# Patient Record
Sex: Female | Born: 1935 | Race: White | Hispanic: No
Health system: Midwestern US, Community
[De-identification: ages and names within clinical notes are randomized; demographics above are authoritative.]

## PROBLEM LIST (undated history)

## (undated) DIAGNOSIS — F03918 Unspecified dementia, unspecified severity, with other behavioral disturbance (HCC): Secondary | ICD-10-CM

## (undated) DIAGNOSIS — F0391 Unspecified dementia with behavioral disturbance: Secondary | ICD-10-CM

## (undated) DIAGNOSIS — I4510 Unspecified right bundle-branch block: Secondary | ICD-10-CM

## (undated) DIAGNOSIS — E785 Hyperlipidemia, unspecified: Secondary | ICD-10-CM

## (undated) DIAGNOSIS — F0280 Dementia in other diseases classified elsewhere without behavioral disturbance: Secondary | ICD-10-CM

## (undated) DIAGNOSIS — R42 Dizziness and giddiness: Secondary | ICD-10-CM

## (undated) DIAGNOSIS — E039 Hypothyroidism, unspecified: Secondary | ICD-10-CM

## (undated) DIAGNOSIS — K219 Gastro-esophageal reflux disease without esophagitis: Secondary | ICD-10-CM

## (undated) DIAGNOSIS — G309 Alzheimer's disease, unspecified: Secondary | ICD-10-CM

## (undated) HISTORY — PX: OTHER SURGICAL HISTORY: SHX169

## (undated) HISTORY — PX: TONSILLECTOMY: SUR1361

## (undated) HISTORY — DX: Gastro-esophageal reflux disease without esophagitis: K21.9

## (undated) HISTORY — PX: TUBAL LIGATION: SHX77

## (undated) HISTORY — PX: PARTIAL HYSTERECTOMY: SHX80

## (undated) HISTORY — PX: EYE SURGERY: SHX253

## (undated) HISTORY — DX: Hyperlipidemia, unspecified: E78.5

## (undated) HISTORY — PX: NASAL SINUS SURGERY: SHX719

---

## 2002-11-22 ENCOUNTER — Ambulatory Visit (HOSPITAL_COMMUNITY): Admission: RE | Admit: 2002-11-22 | Discharge: 2002-11-22 | Payer: Self-pay | Admitting: Family Medicine

## 2002-11-22 ENCOUNTER — Encounter: Payer: Self-pay | Admitting: Family Medicine

## 2003-06-10 ENCOUNTER — Ambulatory Visit (HOSPITAL_COMMUNITY): Admission: RE | Admit: 2003-06-10 | Discharge: 2003-06-10 | Payer: Self-pay | Admitting: Family Medicine

## 2004-12-11 ENCOUNTER — Ambulatory Visit (HOSPITAL_COMMUNITY): Admission: RE | Admit: 2004-12-11 | Discharge: 2004-12-11 | Payer: Self-pay | Admitting: Family Medicine

## 2005-04-11 ENCOUNTER — Ambulatory Visit (HOSPITAL_COMMUNITY): Admission: RE | Admit: 2005-04-11 | Discharge: 2005-04-11 | Payer: Self-pay | Admitting: Family Medicine

## 2005-04-22 ENCOUNTER — Encounter (HOSPITAL_COMMUNITY): Admission: RE | Admit: 2005-04-22 | Discharge: 2005-05-04 | Payer: Self-pay | Admitting: Family Medicine

## 2005-05-06 ENCOUNTER — Encounter (HOSPITAL_COMMUNITY): Admission: RE | Admit: 2005-05-06 | Discharge: 2005-06-05 | Payer: Self-pay | Admitting: Family Medicine

## 2006-01-03 ENCOUNTER — Ambulatory Visit: Payer: Self-pay | Admitting: Internal Medicine

## 2006-01-10 ENCOUNTER — Ambulatory Visit (HOSPITAL_COMMUNITY): Admission: RE | Admit: 2006-01-10 | Discharge: 2006-01-10 | Payer: Self-pay | Admitting: Internal Medicine

## 2006-04-25 ENCOUNTER — Ambulatory Visit: Payer: Self-pay | Admitting: Internal Medicine

## 2006-05-27 ENCOUNTER — Ambulatory Visit: Payer: Self-pay | Admitting: Internal Medicine

## 2006-06-13 ENCOUNTER — Ambulatory Visit: Payer: Self-pay | Admitting: Internal Medicine

## 2006-06-18 ENCOUNTER — Ambulatory Visit: Payer: Self-pay | Admitting: Internal Medicine

## 2006-09-03 ENCOUNTER — Ambulatory Visit (HOSPITAL_COMMUNITY): Admission: RE | Admit: 2006-09-03 | Discharge: 2006-09-03 | Payer: Self-pay | Admitting: Ophthalmology

## 2007-01-22 ENCOUNTER — Telehealth (INDEPENDENT_AMBULATORY_CARE_PROVIDER_SITE_OTHER): Payer: Self-pay | Admitting: Internal Medicine

## 2007-01-26 ENCOUNTER — Ambulatory Visit: Payer: Self-pay | Admitting: Internal Medicine

## 2007-01-26 DIAGNOSIS — M129 Arthropathy, unspecified: Secondary | ICD-10-CM | POA: Insufficient documentation

## 2007-01-26 DIAGNOSIS — M545 Low back pain: Secondary | ICD-10-CM

## 2007-01-26 DIAGNOSIS — H269 Unspecified cataract: Secondary | ICD-10-CM

## 2007-01-26 DIAGNOSIS — K219 Gastro-esophageal reflux disease without esophagitis: Secondary | ICD-10-CM

## 2007-01-26 DIAGNOSIS — E785 Hyperlipidemia, unspecified: Secondary | ICD-10-CM | POA: Insufficient documentation

## 2007-01-26 DIAGNOSIS — T570X4A Toxic effect of arsenic and its compounds, undetermined, initial encounter: Secondary | ICD-10-CM

## 2007-01-26 DIAGNOSIS — Z85828 Personal history of other malignant neoplasm of skin: Secondary | ICD-10-CM

## 2007-01-26 DIAGNOSIS — Z87898 Personal history of other specified conditions: Secondary | ICD-10-CM

## 2007-01-26 DIAGNOSIS — J309 Allergic rhinitis, unspecified: Secondary | ICD-10-CM | POA: Insufficient documentation

## 2007-01-29 ENCOUNTER — Ambulatory Visit (HOSPITAL_COMMUNITY): Admission: RE | Admit: 2007-01-29 | Discharge: 2007-01-29 | Payer: Self-pay | Admitting: Internal Medicine

## 2007-01-30 LAB — CONVERTED CEMR LAB
ALT: 19 units/L (ref 0–35)
AST: 23 units/L (ref 0–37)
Albumin: 4.4 g/dL (ref 3.5–5.2)
Alkaline Phosphatase: 147 units/L — ABNORMAL HIGH (ref 39–117)
BUN: 20 mg/dL (ref 6–23)
Basophils Absolute: 0 10*3/uL (ref 0.0–0.1)
Basophils Relative: 1 % (ref 0–1)
CO2: 28 meq/L (ref 19–32)
Calcium: 9.7 mg/dL (ref 8.4–10.5)
Chloride: 103 meq/L (ref 96–112)
Cholesterol: 267 mg/dL — ABNORMAL HIGH (ref 0–200)
Creatinine, Ser: 0.87 mg/dL (ref 0.40–1.20)
Eosinophils Absolute: 0.1 10*3/uL (ref 0.0–0.7)
Eosinophils Relative: 2 % (ref 0–5)
Glucose, Bld: 95 mg/dL (ref 70–99)
HCT: 46.2 % — ABNORMAL HIGH (ref 36.0–46.0)
HDL: 43 mg/dL (ref >39)
Hemoglobin: 15.5 g/dL — ABNORMAL HIGH (ref 12.0–15.0)
LDL Cholesterol: 184 mg/dL — ABNORMAL HIGH (ref 0–99)
Lymphocytes Relative: 38 % (ref 12–46)
Lymphs Abs: 2.3 10*3/uL (ref 0.7–3.3)
MCHC: 33.5 g/dL (ref 30.0–36.0)
MCV: 90.8 fL (ref 78.0–100.0)
Monocytes Absolute: 0.7 10*3/uL (ref 0.2–0.7)
Monocytes Relative: 11 % (ref 3–11)
Neutro Abs: 3 10*3/uL (ref 1.7–7.7)
Neutrophils Relative %: 49 % (ref 43–77)
Platelets: 313 10*3/uL (ref 150–400)
Potassium: 4.7 meq/L (ref 3.5–5.3)
RBC: 5.09 M/uL (ref 3.87–5.11)
RDW: 13.3 % (ref 11.5–14.0)
Sodium: 140 meq/L (ref 135–145)
Total Bilirubin: 0.5 mg/dL (ref 0.3–1.2)
Total CHOL/HDL Ratio: 6.2
Total Protein: 6.9 g/dL (ref 6.0–8.3)
Triglycerides: 202 mg/dL — ABNORMAL HIGH (ref <150)
VLDL: 40 mg/dL (ref 0–40)
WBC: 6.2 10*3/uL (ref 4.0–10.5)

## 2007-05-12 ENCOUNTER — Ambulatory Visit: Payer: Self-pay | Admitting: Internal Medicine

## 2007-09-11 ENCOUNTER — Ambulatory Visit: Payer: Self-pay | Admitting: Internal Medicine

## 2007-09-11 DIAGNOSIS — J069 Acute upper respiratory infection, unspecified: Secondary | ICD-10-CM | POA: Insufficient documentation

## 2007-09-11 DIAGNOSIS — G4700 Insomnia, unspecified: Secondary | ICD-10-CM | POA: Insufficient documentation

## 2007-11-19 ENCOUNTER — Telehealth (INDEPENDENT_AMBULATORY_CARE_PROVIDER_SITE_OTHER): Payer: Self-pay | Admitting: Internal Medicine

## 2007-11-20 ENCOUNTER — Ambulatory Visit: Payer: Self-pay | Admitting: Internal Medicine

## 2007-11-20 DIAGNOSIS — IMO0002 Reserved for concepts with insufficient information to code with codable children: Secondary | ICD-10-CM

## 2007-11-20 DIAGNOSIS — M25519 Pain in unspecified shoulder: Secondary | ICD-10-CM

## 2007-12-01 ENCOUNTER — Telehealth (INDEPENDENT_AMBULATORY_CARE_PROVIDER_SITE_OTHER): Payer: Self-pay | Admitting: Internal Medicine

## 2008-01-13 ENCOUNTER — Ambulatory Visit: Payer: Self-pay | Admitting: Internal Medicine

## 2008-04-20 ENCOUNTER — Encounter (INDEPENDENT_AMBULATORY_CARE_PROVIDER_SITE_OTHER): Payer: Self-pay | Admitting: Internal Medicine

## 2008-05-04 ENCOUNTER — Ambulatory Visit: Payer: Self-pay | Admitting: Internal Medicine

## 2008-05-09 ENCOUNTER — Ambulatory Visit: Payer: Self-pay | Admitting: Internal Medicine

## 2008-05-09 DIAGNOSIS — J029 Acute pharyngitis, unspecified: Secondary | ICD-10-CM | POA: Insufficient documentation

## 2008-05-18 ENCOUNTER — Encounter (INDEPENDENT_AMBULATORY_CARE_PROVIDER_SITE_OTHER): Payer: Self-pay | Admitting: Internal Medicine

## 2008-05-19 ENCOUNTER — Telehealth (INDEPENDENT_AMBULATORY_CARE_PROVIDER_SITE_OTHER): Payer: Self-pay | Admitting: *Deleted

## 2008-05-24 ENCOUNTER — Telehealth (INDEPENDENT_AMBULATORY_CARE_PROVIDER_SITE_OTHER): Payer: Self-pay | Admitting: *Deleted

## 2008-06-01 ENCOUNTER — Encounter (INDEPENDENT_AMBULATORY_CARE_PROVIDER_SITE_OTHER): Payer: Self-pay | Admitting: Internal Medicine

## 2008-07-11 ENCOUNTER — Ambulatory Visit: Payer: Self-pay | Admitting: Internal Medicine

## 2008-07-11 DIAGNOSIS — J310 Chronic rhinitis: Secondary | ICD-10-CM | POA: Insufficient documentation

## 2008-07-11 DIAGNOSIS — IMO0001 Reserved for inherently not codable concepts without codable children: Secondary | ICD-10-CM

## 2008-07-11 LAB — CONVERTED CEMR LAB
Inflenza A Ag: NEGATIVE
Influenza B Ag: NEGATIVE

## 2008-07-12 ENCOUNTER — Encounter (INDEPENDENT_AMBULATORY_CARE_PROVIDER_SITE_OTHER): Payer: Self-pay | Admitting: Internal Medicine

## 2008-07-12 LAB — CONVERTED CEMR LAB
ALT: 24 units/L (ref 0–35)
AST: 24 units/L (ref 0–37)
Albumin: 4.3 g/dL (ref 3.5–5.2)
Alkaline Phosphatase: 152 units/L — ABNORMAL HIGH (ref 39–117)
BUN: 12 mg/dL (ref 6–23)
Basophils Absolute: 0 10*3/uL (ref 0.0–0.1)
Basophils Relative: 0 % (ref 0–1)
CO2: 27 meq/L (ref 19–32)
Calcium: 9.2 mg/dL (ref 8.4–10.5)
Chloride: 102 meq/L (ref 96–112)
Creatinine, Ser: 0.81 mg/dL (ref 0.40–1.20)
Eosinophils Absolute: 0.2 10*3/uL (ref 0.0–0.7)
Eosinophils Relative: 3 % (ref 0–5)
Glucose, Bld: 89 mg/dL (ref 70–99)
HCT: 45.1 % (ref 36.0–46.0)
Hemoglobin: 15 g/dL (ref 12.0–15.0)
Lymphocytes Relative: 31 % (ref 12–46)
Lymphs Abs: 2.6 10*3/uL (ref 0.7–4.0)
MCHC: 33.3 g/dL (ref 30.0–36.0)
MCV: 91.1 fL (ref 78.0–100.0)
Monocytes Absolute: 1 10*3/uL (ref 0.1–1.0)
Monocytes Relative: 12 % (ref 3–12)
Neutro Abs: 4.4 10*3/uL (ref 1.7–7.7)
Neutrophils Relative %: 54 % (ref 43–77)
Platelets: 362 10*3/uL (ref 150–400)
Potassium: 4.4 meq/L (ref 3.5–5.3)
RBC: 4.95 M/uL (ref 3.87–5.11)
RDW: 13.5 % (ref 11.5–15.5)
Sed Rate: 2 mm/hr (ref 0–22)
Sodium: 140 meq/L (ref 135–145)
TSH: 4.964 microintl units/mL — ABNORMAL HIGH (ref 0.350–4.50)
Total Bilirubin: 0.3 mg/dL (ref 0.3–1.2)
Total CK: 60 units/L (ref 7–177)
Total Protein: 6.7 g/dL (ref 6.0–8.3)
WBC: 8.2 10*3/uL (ref 4.0–10.5)

## 2008-07-13 LAB — CONVERTED CEMR LAB
Free T4: 1 ng/dL (ref 0.89–1.80)
T3, Free: 3 pg/mL (ref 2.3–4.2)

## 2008-08-17 ENCOUNTER — Ambulatory Visit: Payer: Self-pay | Admitting: Internal Medicine

## 2008-09-12 ENCOUNTER — Encounter (INDEPENDENT_AMBULATORY_CARE_PROVIDER_SITE_OTHER): Payer: Self-pay | Admitting: Internal Medicine

## 2008-09-14 ENCOUNTER — Ambulatory Visit: Payer: Self-pay | Admitting: Internal Medicine

## 2008-09-14 DIAGNOSIS — H349 Unspecified retinal vascular occlusion: Secondary | ICD-10-CM | POA: Insufficient documentation

## 2008-09-15 ENCOUNTER — Encounter (INDEPENDENT_AMBULATORY_CARE_PROVIDER_SITE_OTHER): Payer: Self-pay | Admitting: Internal Medicine

## 2008-09-16 LAB — CONVERTED CEMR LAB
BUN: 14 mg/dL (ref 6–23)
CO2: 25 meq/L (ref 19–32)
Calcium: 9.5 mg/dL (ref 8.4–10.5)
Chloride: 104 meq/L (ref 96–112)
Cholesterol: 268 mg/dL — ABNORMAL HIGH (ref 0–200)
Creatinine, Ser: 0.91 mg/dL (ref 0.40–1.20)
Glucose, Bld: 100 mg/dL — ABNORMAL HIGH (ref 70–99)
HDL: 48 mg/dL (ref >39)
LDL Cholesterol: 180 mg/dL — ABNORMAL HIGH (ref 0–99)
Potassium: 4.4 meq/L (ref 3.5–5.3)
Sodium: 142 meq/L (ref 135–145)
Total CHOL/HDL Ratio: 5.6
Triglycerides: 199 mg/dL — ABNORMAL HIGH (ref <150)
VLDL: 40 mg/dL (ref 0–40)

## 2008-10-06 ENCOUNTER — Encounter (INDEPENDENT_AMBULATORY_CARE_PROVIDER_SITE_OTHER): Payer: Self-pay | Admitting: Internal Medicine

## 2008-10-07 ENCOUNTER — Ambulatory Visit: Payer: Self-pay | Admitting: Internal Medicine

## 2008-10-10 ENCOUNTER — Encounter: Admission: RE | Admit: 2008-10-10 | Discharge: 2008-10-10 | Payer: Self-pay | Admitting: Otolaryngology

## 2008-10-13 ENCOUNTER — Telehealth (INDEPENDENT_AMBULATORY_CARE_PROVIDER_SITE_OTHER): Payer: Self-pay | Admitting: Internal Medicine

## 2008-11-23 ENCOUNTER — Ambulatory Visit (HOSPITAL_COMMUNITY): Admission: RE | Admit: 2008-11-23 | Discharge: 2008-11-23 | Payer: Self-pay | Admitting: Family Medicine

## 2008-11-29 ENCOUNTER — Encounter (INDEPENDENT_AMBULATORY_CARE_PROVIDER_SITE_OTHER): Payer: Self-pay | Admitting: Internal Medicine

## 2008-12-12 ENCOUNTER — Encounter: Payer: Self-pay | Admitting: Gastroenterology

## 2008-12-28 ENCOUNTER — Encounter: Payer: Self-pay | Admitting: Gastroenterology

## 2008-12-28 ENCOUNTER — Ambulatory Visit (HOSPITAL_COMMUNITY): Admission: RE | Admit: 2008-12-28 | Discharge: 2008-12-28 | Payer: Self-pay | Admitting: Gastroenterology

## 2008-12-28 ENCOUNTER — Ambulatory Visit: Payer: Self-pay | Admitting: Gastroenterology

## 2008-12-29 HISTORY — PX: COLONOSCOPY: SHX174

## 2008-12-30 ENCOUNTER — Encounter: Payer: Self-pay | Admitting: Gastroenterology

## 2009-04-17 ENCOUNTER — Encounter: Payer: Self-pay | Admitting: Gastroenterology

## 2009-04-24 ENCOUNTER — Encounter: Payer: Self-pay | Admitting: Gastroenterology

## 2009-04-24 ENCOUNTER — Ambulatory Visit (HOSPITAL_COMMUNITY): Admission: RE | Admit: 2009-04-24 | Discharge: 2009-04-24 | Payer: Self-pay | Admitting: Gastroenterology

## 2009-04-24 ENCOUNTER — Ambulatory Visit: Payer: Self-pay | Admitting: Gastroenterology

## 2009-09-05 ENCOUNTER — Ambulatory Visit (HOSPITAL_COMMUNITY): Admission: RE | Admit: 2009-09-05 | Discharge: 2009-09-05 | Payer: Self-pay | Admitting: Family Medicine

## 2009-12-21 ENCOUNTER — Ambulatory Visit (HOSPITAL_COMMUNITY)
Admission: RE | Admit: 2009-12-21 | Discharge: 2009-12-21 | Payer: Self-pay | Source: Home / Self Care | Admitting: Ophthalmology

## 2010-01-11 ENCOUNTER — Ambulatory Visit (HOSPITAL_COMMUNITY): Admission: RE | Admit: 2010-01-11 | Discharge: 2010-01-12 | Payer: Self-pay | Admitting: Ophthalmology

## 2010-04-12 ENCOUNTER — Ambulatory Visit (HOSPITAL_COMMUNITY): Admission: RE | Admit: 2010-04-12 | Discharge: 2010-04-12 | Payer: Self-pay | Admitting: Family Medicine

## 2010-08-27 ENCOUNTER — Encounter: Payer: Self-pay | Admitting: Internal Medicine

## 2010-10-12 ENCOUNTER — Encounter (HOSPITAL_COMMUNITY): Payer: Medicare Other

## 2010-10-12 ENCOUNTER — Other Ambulatory Visit: Payer: Self-pay | Admitting: Urology

## 2010-10-12 LAB — PROTIME-INR
INR: 0.93 (ref 0.00–1.49)
Prothrombin Time: 12.7 seconds (ref 11.6–15.2)

## 2010-10-12 LAB — SURGICAL PCR SCREEN
MRSA, PCR: NEGATIVE
Staphylococcus aureus: NEGATIVE

## 2010-10-12 LAB — CBC
HCT: 43.5 % (ref 36.0–46.0)
Hemoglobin: 14.2 g/dL (ref 12.0–15.0)
MCH: 29.6 pg (ref 26.0–34.0)
MCHC: 32.6 g/dL (ref 30.0–36.0)
MCV: 90.6 fL (ref 78.0–100.0)
Platelets: 277 10*3/uL (ref 150–400)
RBC: 4.8 MIL/uL (ref 3.87–5.11)
RDW: 13.1 % (ref 11.5–15.5)
WBC: 8.3 10*3/uL (ref 4.0–10.5)

## 2010-10-12 LAB — APTT: aPTT: 30 seconds (ref 24–37)

## 2010-10-16 ENCOUNTER — Observation Stay (HOSPITAL_COMMUNITY)
Admission: RE | Admit: 2010-10-16 | Discharge: 2010-10-17 | Disposition: A | Discharge status: Home / Self Care | Payer: Medicare Other | Source: Ambulatory Visit | Attending: Urology | Admitting: Urology

## 2010-10-16 DIAGNOSIS — N8111 Cystocele, midline: Principal | ICD-10-CM | POA: Insufficient documentation

## 2010-10-16 DIAGNOSIS — Z7982 Long term (current) use of aspirin: Secondary | ICD-10-CM | POA: Insufficient documentation

## 2010-10-16 DIAGNOSIS — E669 Obesity, unspecified: Secondary | ICD-10-CM | POA: Insufficient documentation

## 2010-10-16 DIAGNOSIS — K219 Gastro-esophageal reflux disease without esophagitis: Secondary | ICD-10-CM | POA: Insufficient documentation

## 2010-10-16 DIAGNOSIS — Z01812 Encounter for preprocedural laboratory examination: Secondary | ICD-10-CM | POA: Insufficient documentation

## 2010-10-16 DIAGNOSIS — Z79899 Other long term (current) drug therapy: Secondary | ICD-10-CM | POA: Insufficient documentation

## 2010-10-16 DIAGNOSIS — Z01818 Encounter for other preprocedural examination: Secondary | ICD-10-CM | POA: Insufficient documentation

## 2010-10-16 LAB — ABO/RH: ABO/RH(D): A POS

## 2010-10-16 LAB — TYPE AND SCREEN
ABO/RH(D): A POS
Antibody Screen: NEGATIVE

## 2010-10-16 LAB — BASIC METABOLIC PANEL
BUN: 14 mg/dL (ref 6–23)
CO2: 29 mEq/L (ref 19–32)
Calcium: 8.4 mg/dL (ref 8.4–10.5)
Chloride: 106 mEq/L (ref 96–112)
Creatinine, Ser: 0.85 mg/dL (ref 0.4–1.2)
GFR calc Af Amer: >60 mL/min (ref >60)
GFR calc non Af Amer: >60 mL/min (ref >60)
Glucose, Bld: 144 mg/dL — ABNORMAL HIGH (ref 70–99)
Potassium: 4.2 mEq/L (ref 3.5–5.1)
Sodium: 139 mEq/L (ref 135–145)

## 2010-10-16 LAB — HEMOGLOBIN AND HEMATOCRIT, BLOOD
HCT: 37.2 % (ref 36.0–46.0)
Hemoglobin: 12.2 g/dL (ref 12.0–15.0)

## 2010-10-17 LAB — BASIC METABOLIC PANEL
BUN: 9 mg/dL (ref 6–23)
CO2: 30 mEq/L (ref 19–32)
Calcium: 8.5 mg/dL (ref 8.4–10.5)
Chloride: 105 mEq/L (ref 96–112)
Creatinine, Ser: 0.89 mg/dL (ref 0.4–1.2)
GFR calc Af Amer: >60 mL/min (ref >60)
GFR calc non Af Amer: >60 mL/min (ref >60)
Glucose, Bld: 109 mg/dL — ABNORMAL HIGH (ref 70–99)
Potassium: 4.2 mEq/L (ref 3.5–5.1)
Sodium: 138 mEq/L (ref 135–145)

## 2010-10-17 LAB — HEMOGLOBIN AND HEMATOCRIT, BLOOD
HCT: 39.4 % (ref 36.0–46.0)
Hemoglobin: 12.6 g/dL (ref 12.0–15.0)

## 2010-10-18 NOTE — Op Note (Signed)
Claudia Wiggins, Claudia Wiggins                 ACCOUNT NO.:  [id number]  MEDICAL RECORD NO.:  [id number]           PATIENT TYPE:  O  LOCATION:  DAYL                         FACILITY:  [facility]  PHYSICIAN:  Martina Sinner, MD DATE OF BIRTH:  06/24/1934  DATE OF PROCEDURE:  10/16/2010 DATE OF DISCHARGE:                              OPERATIVE REPORT   ASSISTANT:  Delia Chimes, NP.  PREOPERATIVE DIAGNOSES: 1. Cystocele. 2. Vault prolapse.  POSTOPERATIVE DIAGNOSES: 1. Cystocele. 2. Vault prolapse.  SURGERY:  Vault suspension plus cystocele repair plus graft plus cystoscopy.  DESCRIPTION OF PROCEDURE:  Ms. Haubner has the above-mentioned diagnosis. Extra care was taken to position the patient and minimize the risk of compartment syndrome, neuropathy, and DVT.  Preoperative laboratory tests were normal.  Careful inspection was similar to the examination when she was awake.  Her vaginal cuff descended 3-4 cm but actually was reasonably well-supported.  She had a moderate grade 2 cystocele that will bulge almost to the urethrovesical angle.  Once again, it did not look that impressive but was symptomatic.  She had a short vagina.  I placed a 3-0 Vicryl at the sacral dimples, stained cephalad to maximize my length.  I instilled approximately 12-14 mL of lidocaine epinephrine mixture anteriorly.  I made a T-shaped anterior vaginal wall incision and sharply and bluntly dissected the underlying pubocervical fascia from the pelvic sidewall to the white line bilaterally.  I was very happy with the mobilization.  Once again, the cystocele was not that large, and she had a narrow smaller vagina.  Without distorting the anatomy, I did a 2-layer anterior repair.  I was very pleased with it.  I cystoscoped the patient.  Cystoscopically, the repair looked very good.  There was excellent blue jets bilaterally.  There is no injury to the bladder.  I did not imbricate the bladder neck.  I finger  dissected along the white line towards the ischial spine and could feel them bilaterally.  I decided to do an iliococcygeus vault suspension.  Using a Capio device, I placed a 0 Ethibond beyond the ischial spine in a line between the ischial spine and urethrovesical angle along the white line.  I placed an Ethibond bilaterally and was very pleased with the position of the suture in terms of an anatomic iliococcygeus repair.  I then placed 0 Vicryl on a UR6 needle using my usual technique at the level of the urethrovesical angle.  I used a 7 x 4 cm piece of dermis and cut it in a shape of a trapezoid. I sewed the graft in place anatomically and it fit very nicely.  It was not too tight at the bladder neck.  I sutured it to the apical dimples with 3-0 Vicryl twice.  I trimmed a few millimeters of anterior vaginal wall mucosa since there was not a lot of redundancy.  I closed the anterior vaginal wall with 0 Vicryl on a CT-1 needle after a lot of irrigation.  An Estrace pack was inserted.  A hole pack was not inserted since she had a smaller vagina.  Leg position was  good.  Bladder was draining blue urine.  I was very pleased with the surgery.  Hopefully, we will reach her treatment goal.          ______________________________ Martina Sinner, MD     SAM/MEDQ  D:  10/16/2010  T:  10/16/2010  Job:  [id number]  Electronically Signed by Alfredo Martinez MD on 10/18/2010 12:50:36 PM

## 2010-10-22 LAB — CBC
HCT: 43.4 % (ref 36.0–46.0)
Hemoglobin: 14.7 g/dL (ref 12.0–15.0)
MCHC: 34 g/dL (ref 30.0–36.0)
MCV: 92 fL (ref 78.0–100.0)
Platelets: 280 10*3/uL (ref 150–400)
RBC: 4.71 MIL/uL (ref 3.87–5.11)
RDW: 13.1 % (ref 11.5–15.5)
WBC: 7.4 10*3/uL (ref 4.0–10.5)

## 2010-10-22 LAB — BASIC METABOLIC PANEL
BUN: 13 mg/dL (ref 6–23)
CO2: 28 mEq/L (ref 19–32)
Calcium: 9.1 mg/dL (ref 8.4–10.5)
Chloride: 106 mEq/L (ref 96–112)
Creatinine, Ser: 0.9 mg/dL (ref 0.4–1.2)
GFR calc Af Amer: >60 mL/min (ref >60)
GFR calc non Af Amer: >60 mL/min (ref >60)
Glucose, Bld: 107 mg/dL — ABNORMAL HIGH (ref 70–99)
Potassium: 4.5 mEq/L (ref 3.5–5.1)
Sodium: 139 mEq/L (ref 135–145)

## 2010-10-23 LAB — BASIC METABOLIC PANEL
BUN: 16 mg/dL (ref 6–23)
CO2: 30 mEq/L (ref 19–32)
Calcium: 9 mg/dL (ref 8.4–10.5)
Chloride: 104 mEq/L (ref 96–112)
Creatinine, Ser: 0.81 mg/dL (ref 0.4–1.2)
GFR calc Af Amer: >60 mL/min (ref >60)
GFR calc non Af Amer: >60 mL/min (ref >60)
Glucose, Bld: 109 mg/dL — ABNORMAL HIGH (ref 70–99)
Potassium: 5.2 mEq/L — ABNORMAL HIGH (ref 3.5–5.1)
Sodium: 138 mEq/L (ref 135–145)

## 2010-10-23 LAB — HEMOGLOBIN AND HEMATOCRIT, BLOOD
HCT: 41.5 % (ref 36.0–46.0)
Hemoglobin: 14.4 g/dL (ref 12.0–15.0)

## 2010-12-18 NOTE — Op Note (Signed)
NAMEJELISA, Claudia Wiggins                 ACCOUNT NO.:  [id number]   MEDICAL RECORD NO.:  [id number]          PATIENT TYPE:  AMB   LOCATION:  DAY                           FACILITY:  [facility]   PHYSICIAN:  Kassie Mends, M.D.      DATE OF BIRTH:  February 01, 1934   DATE OF PROCEDURE:  DATE OF DISCHARGE:  12/28/2008                               OPERATIVE REPORT   PROCEDURE:  Colonoscopy with snare cautery polypectomy.   INDICATION FOR EXAM:  Claudia Wiggins is a 77 year old female who presents for  average-risk colon cancer screening.   FINDINGS:  1. The patient admitted to not taking the entire prep.  She had a      large amount of liquid stool seen throughout the cecum to the      rectosigmoid junction.  Polyps less than 1 cm would have been      easily missed.  2. An 8-mm sessile cecal polyp removed via snare cautery.  A 1.2-cm      sessile rectosigmoid polyp removed via snare cautery.  Otherwise,      unable to appreciate any masses, inflammatory changes, diverticula,      or AVMs.  3. Large internal hemorrhoids.  Otherwise, normal retroflexed view of      the rectum.  Adequate visualization of the rectum was achieved.   DIAGNOSES:  1. Cecal and rectosigmoid polyps.  2. Large internal hemorrhoids.   RECOMMENDATIONS:  1. The patient should return in 3-6 months with a 2-day MiraLax prep.      She should follow a high-fiber diet.  She was given a handout on      high-fiber diet and polyps.  2. No aspirin, NSAIDs, or anticoagulation for 7 days.   MEDICATIONS:  1. Demerol 50 mg IV.  2. Versed 5 mg IV.   PROCEDURE TECHNIQUE:  Physical exam was performed.  Informed consent was  obtained from the patient after explaining the benefits, risks, and  alternatives to the procedure.  The patient was connected to the monitor  and placed in the left lateral position.  Continuous oxygen was provided  by nasal cannula, IV medicine administered through an indwelling  cannula.  After administration of sedation  and rectal exam, the  patient's rectum was intubated and the scope was advanced under direct  visualization to the cecum.  Scope was removed slowly by carefully  examining the color, texture, anatomy, and integrity of the mucosa on  the way out.  The patient was recovered in endoscopy and discharged home  in satisfactory condition.   ADDENDUM 16109:  Poor bowel prep. TV adenoma without dysplasia. TCS in 3-  6 mos with 2 day prep.      Kassie Mends, M.D.  Electronically Signed     SM/MEDQ  D:  12/28/2008  T:  12/29/2008  Job:  [id number]   cc:   Melvyn Novas, MD  Fax: [phone]

## 2010-12-31 NOTE — Discharge Summary (Signed)
  NAMETYLAR, MERENDINO                 ACCOUNT NO.:  [id number]  MEDICAL RECORD NO.:  [id number]           PATIENT TYPE:  I  LOCATION:  1412                         FACILITY:  [facility]  PHYSICIAN:  Martina Sinner, MD DATE OF BIRTH:  25-Nov-1933  DATE OF ADMISSION:  10/16/2010 DATE OF DISCHARGE:  10/17/2010                              DISCHARGE SUMMARY   ADMISSION DIAGNOSIS:  Cystocele.  DISCHARGE DIAGNOSIS:  Cystocele.  DATE OF SURGERY:  Surgery on the 13th.  PROCEDURE:  Cystocele repair plus vault suspension.  Ms. Zuri Lascala had a vault suspension, was kept in for 23 hours observation.  Laboratory tests were normal 1 day postoperatively.  She was not having pain.  She is ambulating well.  She ate on the day of discharge.  Her surgery went very well with no postoperative complications.  The patient was sent home with pain medication, antibiotics, and has a formal followup appointment.  She was given stool softeners.       ______________________________ Martina Sinner, MD     SAM/MEDQ  D:  12/17/2010  T:  12/17/2010  Job:  [id number]  Electronically Signed by Alfredo Martinez MD on 12/31/2010 06:16:54 PM

## 2011-03-28 ENCOUNTER — Ambulatory Visit (HOSPITAL_COMMUNITY)
Admission: RE | Admit: 2011-03-28 | Discharge: 2011-03-28 | Disposition: A | Discharge status: Home / Self Care | Payer: Medicare Other | Source: Ambulatory Visit | Attending: Physician Assistant | Admitting: Physician Assistant

## 2011-03-28 DIAGNOSIS — R262 Difficulty in walking, not elsewhere classified: Secondary | ICD-10-CM | POA: Insufficient documentation

## 2011-03-28 DIAGNOSIS — M79673 Pain in unspecified foot: Secondary | ICD-10-CM | POA: Insufficient documentation

## 2011-03-28 DIAGNOSIS — M25473 Effusion, unspecified ankle: Secondary | ICD-10-CM | POA: Insufficient documentation

## 2011-03-28 DIAGNOSIS — IMO0001 Reserved for inherently not codable concepts without codable children: Secondary | ICD-10-CM | POA: Insufficient documentation

## 2011-03-28 DIAGNOSIS — M6281 Muscle weakness (generalized): Secondary | ICD-10-CM | POA: Insufficient documentation

## 2011-03-28 DIAGNOSIS — M25579 Pain in unspecified ankle and joints of unspecified foot: Secondary | ICD-10-CM | POA: Insufficient documentation

## 2011-03-28 DIAGNOSIS — M25476 Effusion, unspecified foot: Secondary | ICD-10-CM | POA: Insufficient documentation

## 2011-03-28 NOTE — Progress Notes (Signed)
Physical Therapy Evaluation  Patient Details  Name: JAZZMEN RESTIVO MRN: [id number] Date of Birth: [birth year]  Today's Date: 03/28/2011 Time: 4098-1191 Time Calculation (min): 38 min Visit#: 1 of 8 Re-eval: 04/25/11  Past Medical History: No past medical history on file. Past Surgical History: No past surgical history on file.  Subjective Symptoms/Limitations Symptoms: Ms. Sinor states that she has been having increase pain in her right heel for about eight months now.  The patient states her MD gave her some inserts as well as some medication which has helped her pain about 80%.  She states that if she walks barefoot greater than 10 minutes her pain returns.  The patient states that she is unable to go up steps reciprocally  and at times her pain will go up as high as an 8/10.   The patient has been referred to Physical therapy to allieviate her symptoms.   How long can you sit comfortably?: no problem How long can you stand comfortably?: 10-15 min How long can you walk comfortably?: barefoot 10 min; with shoe and inserts hours. Pain Assessment Currently in Pain?: Yes Pain Score:   2 (goes up as high as an 8) Pain Location: Heel Pain Orientation: Right Pain Type: Chronic pain Pain Onset: More than a month ago Pain Frequency: Constant (varies in intensity.) Pain Relieving Factors: medication, Multiple Pain Sites: No  Precautions/Restrictions     Prior Functioning  Home Living Type of Home: House Lives With: Spouse Home Layout: One level Home Access: Stairs to enter (pt also has a ramp) Entrance Stairs-Rails: Right Entrance Stairs-Number of Steps: 2 Prior Function Level of Independence: Independent with basic ADLs Driving: Yes Vocation: Retired Leisure: Hobbies-yes (Comment) 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 Overall Cognitive Status: Appears within functional limits for tasks assessed Arousal/Alertness: Awake/alert Orientation Level: Oriented  X4  Sensation/Coordination/Flexibility    Assessment RLE Assessment RLE Assessment: Exceptions to [facility] RLE AROM (degrees) Right Ankle Dorsiflexion 0-20: 7  Right Ankle Plantar Flexion 0-45: 45  Right Ankle Inversion: 18  Right Ankle Eversion: 22  RLE Strength RLE Overall Strength: Within Functional Limits for tasks assessed  Mobility (including Balance)       Exercise/Treatments Ankle Exercises Ankle Plantar Flexion:  (gastroc stretch 3x30") Ankle Eversion:  (soleus stretch 3x30 sec.)     Plyometrics    Modalities Modalities: Ultrasound Manual Therapy Manual Therapy: Myofascial release Myofascial Release: massage to gastroc area to relieve tension Ultrasound Ultrasound Location: R heel Ultrasound Parameters: 1 MgHZ @ 1.2 w/cm2 Ultrasound Goals: Pain  Physical Therapy Assessment and Plan PT Assessment and Plan Clinical Impression Statement: Pt with history of plantar fascitis and difficuly walking who will benefit from skilled physical therapy to return patient to prior functional level. Rehab Potential: Good PT Frequency: Min 2X/week PT Duration: 4 weeks PT Treatment/Interventions: Therapeutic exercise (massage to gastroc mm; [place] to heel) PT Plan: begin passive toe extension, plantar fasica and slant board stretch next treatment.  NO heel raises!    Goals PT Short Term Goals Time to Complete Goals: 2 weeks PT Short Term Goal 1: I HEP PT Short Term Goal 2: Pain no greater than a 2 PT Long Term Goals PT Long Term Goal 1: 4 wk able to go up and down steps reciprocally without pain PT Long Term Goal 2: Pt to state that she no longer needs to take her medicationl Long Term Goal 3: Patient able to push her lawnmower without sx of increase pain.  Problem List Patient Active Problem List  Diagnoses  . HYPERLIPIDEMIA  . RETINAL VEIN OCCLUSION  . CATARACT NOS  . PHARYNGITIS  . VIRAL URI  . RHINITIS  . ALLERGIC RHINITIS  . GERD  . ARTHRITIS  . SHOULDER PAIN   . LOW BACK PAIN  . MYALGIA  . INSOMNIA  . SHIN SPLINTS  . TOXIC EFFECT, ARSENIC AND ITS COMPOUNDS  . SKIN CANCER, HX OF  . MIGRAINES, HX OF  . Difficulty in walking  . Pain, heel    PT - End of Session Activity Tolerance: Patient tolerated treatment well General Behavior During Session: [facility] for tasks performed Cognition: [facility] for tasks performed   RUSSELL,CINDY 03/28/2011, 4:50 PM  Physician Documentation Your signature is required to indicate approval of the treatment plan as stated above.  Please sign and either send electronically or make a copy of this report for your files and return this physician signed original.   Please mark one 1.__approve of plan  2. ___approve of plan with the following conditions.   ______________________________                                                          _____________________ Physician Signature                                                                                                             Date

## 2011-03-28 NOTE — Patient Instructions (Addendum)
Hep

## 2011-04-03 ENCOUNTER — Ambulatory Visit (HOSPITAL_COMMUNITY)
Admission: RE | Admit: 2011-04-03 | Discharge: 2011-04-03 | Disposition: A | Discharge status: Home / Self Care | Payer: Medicare Other | Source: Ambulatory Visit | Attending: Physical Therapy | Admitting: Physical Therapy

## 2011-04-03 NOTE — Progress Notes (Signed)
Physical Therapy Treatment Patient Details  Name: Claudia Wiggins MRN: [id number] Date of Birth: [birth year]  Today's Date: 04/03/2011 Time: 4098-1191 Time Calculation (min): 45 min Visit#: 2 of 8 Re-eval: 04/25/11  Subjective: Symptoms/Limitations Symptoms: I'm feeling much better. Pain Assessment Currently in Pain?: Yes Pain Score:   2 Pain Location: Ankle Pain Orientation: Right Pain Type: Chronic pain Pain Onset: More than a month ago Pain Frequency: Constant Pain Relieving Factors: Voltaren Gel Effect of Pain on Daily Activities: no change  Precautions/Restrictions     Mobility (including Balance)       Exercise/Treatments Ankle Exercises Ankle Plantar Flexion:  (3x 30" ) Ankle Eversion:  (Gastroc/soleus stretch x 30 sec) Heel Raises:  (slant board stretch 3/30 seconds) Toe Raise:  (passive toe extension)     Plyometrics    Modalities Modalities: Ultrasound Manual Therapy Manual Therapy: Myofascial release Myofascial Release: massage to medial head of gastroc Ultrasound Ultrasound Location: gastroc tendon and heel while pt is prone and passively stretched. Ultrasound Parameters: 1Mg Hz@ 1.3 w/cm2 Ultrasound Goals: Pain  Physical Therapy Assessment and Plan PT Assessment and Plan Clinical Impression Statement: Pt did well with stretches.  add sls next rx. Rehab Potential: Good PT Frequency: Min 2X/week PT Duration: 4 weeks PT Treatment/Interventions: Therapeutic exercise (modalities for stretching ) PT Plan: begin SLS next rx NO HEEl Raises    Goals    Problem List Patient Active Problem List  Diagnoses  . HYPERLIPIDEMIA  . RETINAL VEIN OCCLUSION  . CATARACT NOS  . PHARYNGITIS  . VIRAL URI  . RHINITIS  . ALLERGIC RHINITIS  . GERD  . ARTHRITIS  . SHOULDER PAIN  . LOW BACK PAIN  . MYALGIA  . INSOMNIA  . SHIN SPLINTS  . TOXIC EFFECT, ARSENIC AND ITS COMPOUNDS  . SKIN CANCER, HX OF  . MIGRAINES, HX OF  . Difficulty in walking  . Pain,  heel    PT - End of Session Activity Tolerance: Patient tolerated treatment well General Behavior During Session: [facility] for tasks performed Cognition: [facility] for tasks performed  Olis Viverette,CINDY 04/03/2011, 10:20 AM

## 2011-04-03 NOTE — Patient Instructions (Signed)
stretches

## 2011-04-05 ENCOUNTER — Ambulatory Visit (HOSPITAL_COMMUNITY)
Admission: RE | Admit: 2011-04-05 | Discharge: 2011-04-05 | Disposition: A | Discharge status: Home / Self Care | Payer: Medicare Other | Source: Ambulatory Visit | Attending: Physical Therapy | Admitting: Physical Therapy

## 2011-04-05 NOTE — Progress Notes (Signed)
Physical Therapy Treatment Patient Details  Name: Claudia Wiggins MRN: [id number] Date of Birth: 1934-06-27  Today's Date: 04/05/2011 Time: 4098-1191 Time Calculation (min): 42 min Visit#:3 of 8 Re-eval: 04/25/11  Subjective: Symptoms/Limitations Symptoms: Pt states she is only having a little soreness in the arch when coming from sit to stand.  Precautions/Restrictions     Mobility (including Balance)       Exercise/Treatments Ankle Exercises  per doc flow sheet     Plyometrics    Modalities Modalities: Ultrasound Manual Therapy Manual Therapy: Myofascial release Myofascial Release: medial gastroc  Ultrasound Ultrasound Location: tendon/heel Ultrasound Parameters: 1 MgHz@1 .3 w/cm2 Ultrasound Goals: Pain  Physical Therapy Assessment and Plan PT Assessment and Plan Clinical Impression Statement: Pt I with stretches. Rehab Potential: Good PT Frequency: Min 2X/week PT Duration: 4 weeks PT Plan: if gastroc mm is not tight next treatment then massage can be discharged    Goals    Problem List Patient Active Problem List  Diagnoses  . HYPERLIPIDEMIA  . RETINAL VEIN OCCLUSION  . CATARACT NOS  . PHARYNGITIS  . VIRAL URI  . RHINITIS  . ALLERGIC RHINITIS  . GERD  . ARTHRITIS  . SHOULDER PAIN  . LOW BACK PAIN  . MYALGIA  . INSOMNIA  . SHIN SPLINTS  . TOXIC EFFECT, ARSENIC AND ITS COMPOUNDS  . SKIN CANCER, HX OF  . MIGRAINES, HX OF  . Difficulty in walking  . Pain, heel    PT - End of Session Activity Tolerance: Patient tolerated treatment well General Behavior During Session: [facility] for tasks performed Cognition: [facility] for tasks performed  Roberta Angell,CINDY 04/05/2011, 1:48 PM

## 2011-04-10 ENCOUNTER — Ambulatory Visit (HOSPITAL_COMMUNITY)
Admission: RE | Admit: 2011-04-10 | Discharge: 2011-04-10 | Disposition: A | Discharge status: Home / Self Care | Payer: Medicare Other | Source: Ambulatory Visit | Attending: Physician Assistant | Admitting: Physician Assistant

## 2011-04-10 DIAGNOSIS — M25579 Pain in unspecified ankle and joints of unspecified foot: Secondary | ICD-10-CM | POA: Insufficient documentation

## 2011-04-10 DIAGNOSIS — IMO0001 Reserved for inherently not codable concepts without codable children: Secondary | ICD-10-CM | POA: Insufficient documentation

## 2011-04-10 DIAGNOSIS — M6281 Muscle weakness (generalized): Secondary | ICD-10-CM | POA: Insufficient documentation

## 2011-04-10 DIAGNOSIS — R262 Difficulty in walking, not elsewhere classified: Secondary | ICD-10-CM | POA: Insufficient documentation

## 2011-04-10 DIAGNOSIS — M25473 Effusion, unspecified ankle: Secondary | ICD-10-CM | POA: Insufficient documentation

## 2011-04-10 DIAGNOSIS — M25476 Effusion, unspecified foot: Secondary | ICD-10-CM | POA: Insufficient documentation

## 2011-04-10 NOTE — Progress Notes (Signed)
Physical Therapy Treatment Patient Details  Name: Claudia Wiggins MRN: [id number] Date of Birth: 03/17/1934  Today's Date: 04/10/2011 Time: 1030-1110 Time Calculation (min): 40 min Visit#: 4 of 8 Re-eval: 04/25/11  Subjective: Symptoms/Limitations Symptoms: Pt states the soreness is better   Precautions/Restrictions     Mobility (including Balance)       Exercise/Treatments Ankle Exercises Ankle Plantar Flexion:  (3x 30" ) Ankle Eversion:  (Gastroc/soleus stretch x 30 sec) Heel Raises:  (slant board stretch 3/30 seconds) Toe Raise:  (passive toe extension)  SLS:  (R 20 sec; L 17 sec)  Plyometrics    Modalities Modalities: Ultrasound Manual Therapy Myofascial Release: massage to mm  Ultrasound Ultrasound Location: tendon Ultrasound Parameters: 1.2 w/cm2 Ultrasound Goals: Pain  Physical Therapy Assessment and Plan PT Assessment and Plan Clinical Impression Statement: Pt has increased mm tightness in medial gastroc this treatment Rehab Potential: Good PT Plan: continut with massage as increased tightness this treatment.    Goals    Problem List Patient Active Problem List  Diagnoses  . HYPERLIPIDEMIA  . RETINAL VEIN OCCLUSION  . CATARACT NOS  . PHARYNGITIS  . VIRAL URI  . RHINITIS  . ALLERGIC RHINITIS  . GERD  . ARTHRITIS  . SHOULDER PAIN  . LOW BACK PAIN  . MYALGIA  . INSOMNIA  . SHIN SPLINTS  . TOXIC EFFECT, ARSENIC AND ITS COMPOUNDS  . SKIN CANCER, HX OF  . MIGRAINES, HX OF  . Difficulty in walking  . Pain, heel    PT - End of Session Activity Tolerance: Patient tolerated treatment well General Behavior During Session: [facility] for tasks performed Cognition: [facility] for tasks performed  RUSSELL,CINDY 04/10/2011, 12:20 PM

## 2011-04-12 ENCOUNTER — Ambulatory Visit (HOSPITAL_COMMUNITY)
Admission: RE | Admit: 2011-04-12 | Discharge: 2011-04-12 | Disposition: A | Discharge status: Home / Self Care | Payer: Medicare Other | Source: Ambulatory Visit | Attending: Family Medicine | Admitting: Family Medicine

## 2011-04-12 NOTE — Progress Notes (Signed)
Physical Therapy Treatment Patient Details  Name: DIESHA ROSTAD MRN: [id number] Date of Birth: 1934/03/13  Today's Date: 04/12/2011 Time: 7829-5621 Time Calculation (min): 42 min Visit#: 4 of 8 Re-eval: 04/25/11  Subjective: Symptoms/Limitations Symptoms: Pt states her heel feels a whole lot better. Pain Assessment Currently in Pain?: No/denies  Precautions/Restrictions     Mobility (including Balance)       Exercise/Treatments Ankle Exercises Ankle Plantar Flexion:  (2x60") Ankle Eversion:  (Gastroc/soleus stretch x2 x 60") Heel Raises:  (slant board stretch 2/60 seconds) Toe Raise:  (passive toe extension)  SLS:  (R 20 sec; L 17 sec)  Plyometrics    Modalities Modalities: Ultrasound Manual Therapy Myofascial Release: massage x8' Ultrasound Ultrasound Location: gastroc tendon and heel Ultrasound Parameters: 3 Mg-Hz @ 1.5w/xm2 x 8 min Ultrasound Goals: Pain  Physical Therapy Assessment and Plan PT Assessment and Plan Clinical Impression Statement: Pt with normal mm tension Rehab Potential: Good PT Plan: D/C massage.     Goals    Problem List Patient Active Problem List  Diagnoses  . HYPERLIPIDEMIA  . RETINAL VEIN OCCLUSION  . CATARACT NOS  . PHARYNGITIS  . VIRAL URI  . RHINITIS  . ALLERGIC RHINITIS  . GERD  . ARTHRITIS  . SHOULDER PAIN  . LOW BACK PAIN  . MYALGIA  . INSOMNIA  . SHIN SPLINTS  . TOXIC EFFECT, ARSENIC AND ITS COMPOUNDS  . SKIN CANCER, HX OF  . MIGRAINES, HX OF  . Difficulty in walking  . Pain, heel    PT - End of Session Activity Tolerance: Patient tolerated treatment well General Behavior During Session: [facility] for tasks performed Cognition: [facility] for tasks performed  RUSSELL,CINDY 04/12/2011, 2:28 PM

## 2011-04-16 ENCOUNTER — Ambulatory Visit (HOSPITAL_COMMUNITY)
Admission: RE | Admit: 2011-04-16 | Discharge: 2011-04-16 | Disposition: A | Discharge status: Home / Self Care | Payer: Medicare Other | Source: Ambulatory Visit | Attending: *Deleted | Admitting: *Deleted

## 2011-04-16 NOTE — Progress Notes (Signed)
Physical Therapy Treatment Patient Details  Name: Claudia Wiggins MRN: [id number] Date of Birth: 1934/02/18  Today's Date: 04/16/2011 Time: 1024 (Pt started by Baird Lyons [place],PTA)-1110 Time Calculation (min): 46 min Visit#: 5 of 8 Re-eval: 04/25/11 Charges: Therex 15' Manual x 10' [place] x 8'  Subjective: Symptoms/Limitations Symptoms: Pt stated no pain today, she rode her TM this weekend for 10' with a little soreness in legs no heel pain Pain Assessment Currently in Pain?: No/denies   Exercise/Treatments Ankle Exercises Ankle Eversion:  (Gastroc/soleus stretch 3 x 60"(soleus) 2x 60"(gastroc)) Heel Raises:  (slant board stretch 3x60 seconds) Toe Raise:  (passive toe extension)  SLS: R 28" L 17" (R 20 sec; L 17 sec)   Physical Therapy Assessment and Plan PT Assessment and Plan Clinical Impression Statement: Minimal spasms noted with manual therapy. Spasms decreased 70% after MFR. Completed massage this tx secondary to pt c/o of increased tightness in R gastroc w/ gastroc stretch. PT Treatment/Interventions: Therapeutic exercise;Other (comment) ([place]; Manual therapy) PT Plan: D/C massage next tx.     Problem List Patient Active Problem List  Diagnoses  . HYPERLIPIDEMIA  . RETINAL VEIN OCCLUSION  . CATARACT NOS  . PHARYNGITIS  . VIRAL URI  . RHINITIS  . ALLERGIC RHINITIS  . GERD  . ARTHRITIS  . SHOULDER PAIN  . LOW BACK PAIN  . MYALGIA  . INSOMNIA  . SHIN SPLINTS  . TOXIC EFFECT, ARSENIC AND ITS COMPOUNDS  . SKIN CANCER, HX OF  . MIGRAINES, HX OF  . Difficulty in walking  . Pain, heel    PT - End of Session Activity Tolerance: Patient tolerated treatment well General Behavior During Session: [facility] for tasks performed Cognition: [facility] for tasks performed  Antonieta Iba 04/16/2011, 11:44 AM

## 2011-04-18 ENCOUNTER — Ambulatory Visit (HOSPITAL_COMMUNITY)
Admission: RE | Admit: 2011-04-18 | Discharge: 2011-04-18 | Disposition: A | Discharge status: Home / Self Care | Payer: Medicare Other | Source: Ambulatory Visit | Attending: Family Medicine | Admitting: Family Medicine

## 2011-04-18 NOTE — Progress Notes (Signed)
Physical Therapy Treatment Patient Details  Name: Claudia Wiggins MRN: [id number] Date of Birth: 11-18-1933  Today's Date: 04/18/2011 Time: 9528-4132 Time Calculation (min): 43 min Visit#: 6 of 8 Re-eval: 04/25/11 Charges:  Therex 15', Manual 8', Ultrasound 8'  Subjective: Symptoms/Limitations Symptoms:   No pain so far; Feeling better. Pain Assessment Currently in Pain?: No/denies    Exercise/Treatments Ankle Exercises  Gastroc/soleus stretch 3 x 60"  Slant board stretch 3x60 seconds  Plantar Fascia Stretch 3X60" Passive toe extension 1X60"  SLS: R 40" L 19"   STM and Ultrasound Ultrasound Location: plantar arch and heel/gastroc Ultrasound Parameters: 3 MHz @ 1.5w/cm2 X 8 minutes Ultrasound Goals: Pain  Physical Therapy Assessment and Plan PT Assessment and Plan Clinical Impression Statement: No spasms with manual today.  Overall improving without pain today. PT Treatment/Interventions: Therapeutic exercise (Ultrasound, STM) PT Plan: Continue X 2 more visits then re-eval.    Goals PT Short Term Goals Time to Complete Goals: 2 weeks PT Short Term Goal 1: I HEP PT Short Term Goal 1 - Progress: Met PT Short Term Goal 2: Pain no greater than a 2 PT Short Term Goal 2 - Progress: Met PT Long Term Goals PT Long Term Goal 1: 4 wk able to go up and down steps reciprocally without pain PT Long Term Goal 1 - Progress: Met PT Long Term Goal 2: Pt to state that she no longer needs to take her medicationl PT Long Term Goal 2 - Progress: Met Long Term Goal 3: Patient able to push her lawnmower without sx of increase pain. Long Term Goal 3 Progress: Met  Problem List Patient Active Problem List  Diagnoses  . HYPERLIPIDEMIA  . RETINAL VEIN OCCLUSION  . CATARACT NOS  . PHARYNGITIS  . VIRAL URI  . RHINITIS  . ALLERGIC RHINITIS  . GERD  . ARTHRITIS  . SHOULDER PAIN  . LOW BACK PAIN  . MYALGIA  . INSOMNIA  . SHIN SPLINTS  . TOXIC EFFECT, ARSENIC AND ITS COMPOUNDS  . SKIN  CANCER, HX OF  . MIGRAINES, HX OF  . Difficulty in walking  . Pain, heel    PT - End of Session Activity Tolerance: Patient tolerated treatment well General Behavior During Session: [facility] for tasks performed Cognition: [facility] for tasks performed  Emeline Gins B 04/18/2011, 10:18 AM

## 2011-04-23 ENCOUNTER — Ambulatory Visit (HOSPITAL_COMMUNITY)
Admission: RE | Admit: 2011-04-23 | Discharge: 2011-04-23 | Disposition: A | Discharge status: Home / Self Care | Payer: Medicare Other | Source: Ambulatory Visit | Attending: Family Medicine | Admitting: Family Medicine

## 2011-04-23 NOTE — Progress Notes (Signed)
Physical Therapy Treatment Patient Details  Name: Claudia Wiggins MRN: [id number] Date of Birth: July 25, 1934  Today's Date: 04/23/2011 Time: 4098-1191 Time Calculation (min): 41 min Visit#: 7  of 8   Re-eval: 04/25/11 Charges: Therex x 20' [place] x 8' Manual x 10'  Subjective: Symptoms/Limitations Symptoms: My pain is about a 1 today. Pain Assessment Currently in Pain?: Yes Pain Score:   1 Pain Location: Ankle Pain Orientation: Right   Exercise/Treatments Ankle Exercises Slant board stretch 3 x 1' Gastroc stretch 3x 1' Plantar stretch 3 x 1' Manual DF self stretch 1 x 30"  Modalities Modalities: Ultrasound Manual Therapy Manual Therapy: Myofascial release Myofascial Release: MFR to release mm spasms in R gastroc/soleus Ultrasound Ultrasound Location: R heel/gastroc Ultrasound Parameters: @ 1.2w/cm2 Ultrasound Goals: Pain  Physical Therapy Assessment and Plan PT Assessment and Plan Clinical Impression Statement: Multiple spasms and tenderness noted in R gastroc/soleus with manual. Pt with pain decrease  to 0/10 at end of session. PT Treatment/Interventions: Therapeutic exercise;Other (comment) ([place]; Manual therapy) PT Plan: Continue x 1 more visits then re-eval.     Problem List Patient Active Problem List  Diagnoses  . HYPERLIPIDEMIA  . RETINAL VEIN OCCLUSION  . CATARACT NOS  . PHARYNGITIS  . VIRAL URI  . RHINITIS  . ALLERGIC RHINITIS  . GERD  . ARTHRITIS  . SHOULDER PAIN  . LOW BACK PAIN  . MYALGIA  . INSOMNIA  . SHIN SPLINTS  . TOXIC EFFECT, ARSENIC AND ITS COMPOUNDS  . SKIN CANCER, HX OF  . MIGRAINES, HX OF  . Difficulty in walking  . Pain, heel    PT - End of Session Activity Tolerance: Patient tolerated treatment well General Behavior During Session: [facility] for tasks performed Cognition: [facility] for tasks performed  Antonieta Iba 04/23/2011, 12:56 PM

## 2011-04-25 ENCOUNTER — Inpatient Hospital Stay (HOSPITAL_COMMUNITY): Admission: RE | Admit: 2011-04-25 | Payer: Medicare Other | Source: Ambulatory Visit | Admitting: Physical Therapy

## 2011-04-25 ENCOUNTER — Telehealth (HOSPITAL_COMMUNITY): Payer: Self-pay

## 2011-04-30 ENCOUNTER — Ambulatory Visit (HOSPITAL_COMMUNITY)
Admission: RE | Admit: 2011-04-30 | Discharge: 2011-04-30 | Disposition: A | Discharge status: Home / Self Care | Payer: Medicare Other | Source: Ambulatory Visit | Attending: Family Medicine | Admitting: Family Medicine

## 2011-04-30 NOTE — Progress Notes (Signed)
Physical Therapy Treatment Patient Details  Name: Claudia Wiggins MRN: [id number] Date of Birth: 21-Mar-1934  Today's Date: 04/30/2011 Time: 4696-2952 Time Calculation (min): 50 min Visit#: 7  of 8   Re-eval: 05/02/11 Charges:  Therex 20, ultrasound 8', manual 12'    Subjective: Symptoms/Limitations Symptoms: Pt. states she had eye surgery and has been unable to come.  States her pain is 1/10 today.  Right is worse than Left Pain Assessment Currently in Pain?: Yes Pain Score:   1 Pain Location: Ankle Pain Orientation: Right   Exercise/Treatments Ankle Exercises Ankle Dorsiflexion: Limitations Ankle Dorsiflexion Limitations: Slant board stretch 3 x 1'; Gastroc stretch 3x 1'; Plantar stretch 3 x 1' Ankle Plantar Flexion Limitations: PF stretch 3X60"  SLS: R:19", L 1:00    Modalities Modalities: Ultrasound Manual Therapy Manual Therapy: Myofascial release Myofascial Release: MFR to decrease spasm in R arch/heel Ultrasound Ultrasound Location: R heel/arch area Ultrasound Parameters: 3 MHz @ 1.4w/cm2 for 8 minutes. Ultrasound Goals: Pain  Physical Therapy Assessment and Plan PT Assessment and Plan Clinical Impression Statement: No spasms palpated in arch/heel, however tightness/pain decreased.  Pt. painfree at end of session. PT Plan: Re-eval next visit.    Goals    Problem List Patient Active Problem List  Diagnoses  . HYPERLIPIDEMIA  . RETINAL VEIN OCCLUSION  . CATARACT NOS  . PHARYNGITIS  . VIRAL URI  . RHINITIS  . ALLERGIC RHINITIS  . GERD  . ARTHRITIS  . SHOULDER PAIN  . LOW BACK PAIN  . MYALGIA  . INSOMNIA  . SHIN SPLINTS  . TOXIC EFFECT, ARSENIC AND ITS COMPOUNDS  . SKIN CANCER, HX OF  . MIGRAINES, HX OF  . Difficulty in walking  . Pain, heel    PT - End of Session Activity Tolerance: Patient tolerated treatment well General Behavior During Session: [facility] for tasks performed Cognition: [facility] for tasks performed  Bascom Levels, Drayk Humbarger B 04/30/2011, 11:09  AM

## 2011-05-02 ENCOUNTER — Ambulatory Visit (HOSPITAL_COMMUNITY)
Admission: RE | Admit: 2011-05-02 | Discharge: 2011-05-02 | Disposition: A | Discharge status: Home / Self Care | Payer: Medicare Other | Source: Ambulatory Visit | Attending: Family Medicine | Admitting: Family Medicine

## 2011-05-02 NOTE — Progress Notes (Addendum)
Physical Therapy Treatment Patient Details  Name: Claudia Wiggins MRN: [id number] Date of Birth: [birth year]  Today's Date: 05/02/2011 Time: 0865-7846 Time Calculation (min): 29 min Visit#: 8  of 8   Re-eval: 05/02/11 Charges: ROM x 1; Manual x 8'   Subjective: Symptoms/Limitations Symptoms: Pt reports she is not having any pain today. Pain Assessment Currently in Pain?: No/denies  Objective: R ankle AROM : WNL         R ankle strength : WNL  Exercise/Treatments  Manual Therapy Manual Therapy: Other (comment) Other Manual Therapy: STM/SCS to R gastroc/soleus x 8'  Physical Therapy Assessment and Plan PT Assessment and Plan Clinical Impression Statement: Manual therapy completed to assess spasms. Multiple spasms noted in medial gastroc. Spasms decreased 80% after STM and SCS. Pt reports HEP compliance and no further functional limitations. All goals have been met. PT Treatment/Interventions: Therapeutic exercise;Other (comment) (Manual therapy) PT Plan: Recommend D/C to PT.    Goals PT Short Term Goals Time to Complete Short Term Goals: 2 weeks PT Short Term Goal 1: I HEP PT Short Term Goal 1 - Progress: Met PT Short Term Goal 2: Pain no greater than a 2 PT Short Term Goal 2 - Progress: Met PT Long Term Goals PT Long Term Goal 1: 4 wk able to go up and down steps reciprocally without pain PT Long Term Goal 1 - Progress: Met PT Long Term Goal 2: Pt to state that she no longer needs to take her medicationl PT Long Term Goal 2 - Progress: Met Long Term Goal 3: Patient able to push her lawnmower without sx of increase pain. Long Term Goal 3 Progress: Met  Problem List Patient Active Problem List  Diagnoses  . HYPERLIPIDEMIA  . RETINAL VEIN OCCLUSION  . CATARACT NOS  . PHARYNGITIS  . VIRAL URI  . RHINITIS  . ALLERGIC RHINITIS  . GERD  . ARTHRITIS  . SHOULDER PAIN  . LOW BACK PAIN  . MYALGIA  . INSOMNIA  . SHIN SPLINTS  . TOXIC EFFECT, ARSENIC AND ITS COMPOUNDS    . SKIN CANCER, HX OF  . MIGRAINES, HX OF  . Difficulty in walking  . Pain, heel    PT - End of Session Activity Tolerance: Patient tolerated treatment well General Behavior During Session: [facility] for tasks performed Cognition: [facility] for tasks performed  Antonieta Iba 05/02/2011, 10:55 AM

## 2011-05-22 ENCOUNTER — Other Ambulatory Visit (HOSPITAL_COMMUNITY): Payer: Self-pay | Admitting: Pulmonary Disease

## 2011-05-22 DIAGNOSIS — M1990 Unspecified osteoarthritis, unspecified site: Secondary | ICD-10-CM

## 2011-05-29 ENCOUNTER — Ambulatory Visit (HOSPITAL_COMMUNITY)
Admission: RE | Admit: 2011-05-29 | Discharge: 2011-05-29 | Disposition: A | Discharge status: Home / Self Care | Payer: Medicare Other | Source: Ambulatory Visit | Attending: Pulmonary Disease | Admitting: Pulmonary Disease

## 2011-05-29 DIAGNOSIS — Z13820 Encounter for screening for osteoporosis: Secondary | ICD-10-CM | POA: Insufficient documentation

## 2011-05-29 DIAGNOSIS — M1990 Unspecified osteoarthritis, unspecified site: Secondary | ICD-10-CM

## 2011-07-09 ENCOUNTER — Ambulatory Visit (INDEPENDENT_AMBULATORY_CARE_PROVIDER_SITE_OTHER): Payer: Medicare Other | Admitting: Orthopedic Surgery

## 2011-07-09 ENCOUNTER — Encounter: Payer: Self-pay | Admitting: Orthopedic Surgery

## 2011-07-09 VITALS — BP 100/70 | Ht 64.0 in | Wt 196.0 lb

## 2011-07-09 DIAGNOSIS — M65319 Trigger thumb, unspecified thumb: Secondary | ICD-10-CM | POA: Insufficient documentation

## 2011-07-09 DIAGNOSIS — M6530 Trigger finger, unspecified finger: Secondary | ICD-10-CM

## 2011-07-09 NOTE — Progress Notes (Signed)
Consult Dr. Juanetta Gosling.  Chief complaint jammed RIGHT thumb.  The patient jammed her RIGHT thumb is 10 weeks ago and since, then. It's been clicking and hurting on the volar aspect. The pain is reported over the A1 pulley with clicking, popping, and catching. Pain 5/10. Tends to come and go. Worse with use.  Most distal skeletal review of systems joint pain of the review of systems seasonal allergy blurred vision remaining systems inclusive negative.  Past Surgical History  Procedure Date  . Tonsillectomy   . Eye surgery   . Bladder lift   . Nasal sinus surgery   . Tubal ligation   . Partial hysterectomy   . Cesarean section     x 4     Physical Exam(6) GENERAL: normal development   CDV: pulses are normal   Skin: normal  Psychiatric: awake, alert and oriented  Neuro: normal sensation  MSK  1 RIGHT thumb alignment is normal. IP joint range of motion MP joint. Range of motion normal. Clicking, locking noted. Flexion, power, extension power normal. Stability of the joint, normal.  Assessment: Trigger thumb    Plan: Inject RIGHT trigger  Injection for trigger thumb.  RIGHT thumb injection  Verbal consent  Time out  Medication: Depo-Medrol 40 mg.  Lidocaine 1% 1 cc.  The skin was prepped with alcohol and upper chloride and then the injection was performed under the A1 pulley good result no complications

## 2011-07-09 NOTE — Patient Instructions (Signed)
You have received a steroid shot. 15% of patients experience increased pain at the injection site with in the next 24 hours. This is best treated with ice and tylenol extra strength 2 tabs every 8 hours. If you are still having pain please call the office.    

## 2011-09-02 DIAGNOSIS — J029 Acute pharyngitis, unspecified: Secondary | ICD-10-CM | POA: Diagnosis not present

## 2011-09-19 DIAGNOSIS — M6530 Trigger finger, unspecified finger: Secondary | ICD-10-CM | POA: Diagnosis not present

## 2011-09-20 DIAGNOSIS — H348392 Tributary (branch) retinal vein occlusion, unspecified eye, stable: Secondary | ICD-10-CM | POA: Diagnosis not present

## 2011-09-20 DIAGNOSIS — H43819 Vitreous degeneration, unspecified eye: Secondary | ICD-10-CM | POA: Diagnosis not present

## 2011-09-20 DIAGNOSIS — H35359 Cystoid macular degeneration, unspecified eye: Secondary | ICD-10-CM | POA: Diagnosis not present

## 2011-12-03 DIAGNOSIS — H348392 Tributary (branch) retinal vein occlusion, unspecified eye, stable: Secondary | ICD-10-CM | POA: Diagnosis not present

## 2011-12-03 DIAGNOSIS — H35359 Cystoid macular degeneration, unspecified eye: Secondary | ICD-10-CM | POA: Diagnosis not present

## 2011-12-04 DIAGNOSIS — E039 Hypothyroidism, unspecified: Secondary | ICD-10-CM | POA: Diagnosis not present

## 2011-12-04 DIAGNOSIS — E785 Hyperlipidemia, unspecified: Secondary | ICD-10-CM | POA: Diagnosis not present

## 2011-12-06 DIAGNOSIS — M5137 Other intervertebral disc degeneration, lumbosacral region: Secondary | ICD-10-CM | POA: Diagnosis not present

## 2011-12-06 DIAGNOSIS — M999 Biomechanical lesion, unspecified: Secondary | ICD-10-CM | POA: Diagnosis not present

## 2011-12-06 DIAGNOSIS — M545 Low back pain: Secondary | ICD-10-CM | POA: Diagnosis not present

## 2011-12-10 DIAGNOSIS — M5137 Other intervertebral disc degeneration, lumbosacral region: Secondary | ICD-10-CM | POA: Diagnosis not present

## 2011-12-10 DIAGNOSIS — M545 Low back pain: Secondary | ICD-10-CM | POA: Diagnosis not present

## 2011-12-10 DIAGNOSIS — M999 Biomechanical lesion, unspecified: Secondary | ICD-10-CM | POA: Diagnosis not present

## 2011-12-11 DIAGNOSIS — E039 Hypothyroidism, unspecified: Secondary | ICD-10-CM | POA: Diagnosis not present

## 2011-12-11 DIAGNOSIS — E785 Hyperlipidemia, unspecified: Secondary | ICD-10-CM | POA: Diagnosis not present

## 2011-12-12 DIAGNOSIS — M545 Low back pain: Secondary | ICD-10-CM | POA: Diagnosis not present

## 2011-12-12 DIAGNOSIS — M5137 Other intervertebral disc degeneration, lumbosacral region: Secondary | ICD-10-CM | POA: Diagnosis not present

## 2011-12-12 DIAGNOSIS — M999 Biomechanical lesion, unspecified: Secondary | ICD-10-CM | POA: Diagnosis not present

## 2011-12-18 DIAGNOSIS — M545 Low back pain: Secondary | ICD-10-CM | POA: Diagnosis not present

## 2011-12-18 DIAGNOSIS — M999 Biomechanical lesion, unspecified: Secondary | ICD-10-CM | POA: Diagnosis not present

## 2011-12-18 DIAGNOSIS — M5137 Other intervertebral disc degeneration, lumbosacral region: Secondary | ICD-10-CM | POA: Diagnosis not present

## 2011-12-31 DIAGNOSIS — J329 Chronic sinusitis, unspecified: Secondary | ICD-10-CM | POA: Diagnosis not present

## 2011-12-31 DIAGNOSIS — J440 Chronic obstructive pulmonary disease with acute lower respiratory infection: Secondary | ICD-10-CM | POA: Diagnosis not present

## 2011-12-31 DIAGNOSIS — D649 Anemia, unspecified: Secondary | ICD-10-CM | POA: Diagnosis not present

## 2012-01-15 DIAGNOSIS — E785 Hyperlipidemia, unspecified: Secondary | ICD-10-CM | POA: Diagnosis not present

## 2012-01-15 DIAGNOSIS — E039 Hypothyroidism, unspecified: Secondary | ICD-10-CM | POA: Diagnosis not present

## 2012-01-15 DIAGNOSIS — M999 Biomechanical lesion, unspecified: Secondary | ICD-10-CM | POA: Diagnosis not present

## 2012-01-15 DIAGNOSIS — M5137 Other intervertebral disc degeneration, lumbosacral region: Secondary | ICD-10-CM | POA: Diagnosis not present

## 2012-01-15 DIAGNOSIS — M546 Pain in thoracic spine: Secondary | ICD-10-CM | POA: Diagnosis not present

## 2012-01-20 DIAGNOSIS — H35379 Puckering of macula, unspecified eye: Secondary | ICD-10-CM | POA: Diagnosis not present

## 2012-01-20 DIAGNOSIS — H43819 Vitreous degeneration, unspecified eye: Secondary | ICD-10-CM | POA: Diagnosis not present

## 2012-01-20 DIAGNOSIS — H348392 Tributary (branch) retinal vein occlusion, unspecified eye, stable: Secondary | ICD-10-CM | POA: Diagnosis not present

## 2012-01-20 DIAGNOSIS — H35359 Cystoid macular degeneration, unspecified eye: Secondary | ICD-10-CM | POA: Diagnosis not present

## 2012-02-10 DIAGNOSIS — H6690 Otitis media, unspecified, unspecified ear: Secondary | ICD-10-CM | POA: Diagnosis not present

## 2012-02-10 DIAGNOSIS — D649 Anemia, unspecified: Secondary | ICD-10-CM | POA: Diagnosis not present

## 2012-02-10 DIAGNOSIS — E785 Hyperlipidemia, unspecified: Secondary | ICD-10-CM | POA: Diagnosis not present

## 2012-02-11 DIAGNOSIS — E785 Hyperlipidemia, unspecified: Secondary | ICD-10-CM | POA: Diagnosis not present

## 2012-02-11 DIAGNOSIS — D649 Anemia, unspecified: Secondary | ICD-10-CM | POA: Diagnosis not present

## 2012-02-11 DIAGNOSIS — H6690 Otitis media, unspecified, unspecified ear: Secondary | ICD-10-CM | POA: Diagnosis not present

## 2012-02-28 DIAGNOSIS — M546 Pain in thoracic spine: Secondary | ICD-10-CM | POA: Diagnosis not present

## 2012-02-28 DIAGNOSIS — M5137 Other intervertebral disc degeneration, lumbosacral region: Secondary | ICD-10-CM | POA: Diagnosis not present

## 2012-02-28 DIAGNOSIS — M999 Biomechanical lesion, unspecified: Secondary | ICD-10-CM | POA: Diagnosis not present

## 2012-03-03 DIAGNOSIS — M79609 Pain in unspecified limb: Secondary | ICD-10-CM | POA: Diagnosis not present

## 2012-03-12 DIAGNOSIS — E039 Hypothyroidism, unspecified: Secondary | ICD-10-CM | POA: Diagnosis not present

## 2012-03-12 DIAGNOSIS — J309 Allergic rhinitis, unspecified: Secondary | ICD-10-CM | POA: Diagnosis not present

## 2012-03-12 DIAGNOSIS — E785 Hyperlipidemia, unspecified: Secondary | ICD-10-CM | POA: Diagnosis not present

## 2012-04-01 DIAGNOSIS — I8310 Varicose veins of unspecified lower extremity with inflammation: Secondary | ICD-10-CM | POA: Diagnosis not present

## 2012-04-21 DIAGNOSIS — H35359 Cystoid macular degeneration, unspecified eye: Secondary | ICD-10-CM | POA: Diagnosis not present

## 2012-04-21 DIAGNOSIS — H35379 Puckering of macula, unspecified eye: Secondary | ICD-10-CM | POA: Diagnosis not present

## 2012-04-21 DIAGNOSIS — H348392 Tributary (branch) retinal vein occlusion, unspecified eye, stable: Secondary | ICD-10-CM | POA: Diagnosis not present

## 2012-04-21 DIAGNOSIS — H43819 Vitreous degeneration, unspecified eye: Secondary | ICD-10-CM | POA: Diagnosis not present

## 2012-04-22 DIAGNOSIS — Z23 Encounter for immunization: Secondary | ICD-10-CM | POA: Diagnosis not present

## 2012-04-23 DIAGNOSIS — I8310 Varicose veins of unspecified lower extremity with inflammation: Secondary | ICD-10-CM | POA: Diagnosis not present

## 2012-04-23 DIAGNOSIS — M79609 Pain in unspecified limb: Secondary | ICD-10-CM | POA: Diagnosis not present

## 2012-04-24 DIAGNOSIS — M5137 Other intervertebral disc degeneration, lumbosacral region: Secondary | ICD-10-CM | POA: Diagnosis not present

## 2012-04-24 DIAGNOSIS — M545 Low back pain: Secondary | ICD-10-CM | POA: Diagnosis not present

## 2012-04-24 DIAGNOSIS — M999 Biomechanical lesion, unspecified: Secondary | ICD-10-CM | POA: Diagnosis not present

## 2012-05-07 DIAGNOSIS — I8310 Varicose veins of unspecified lower extremity with inflammation: Secondary | ICD-10-CM | POA: Diagnosis not present

## 2012-05-07 DIAGNOSIS — M79609 Pain in unspecified limb: Secondary | ICD-10-CM | POA: Diagnosis not present

## 2012-05-07 DIAGNOSIS — M7981 Nontraumatic hematoma of soft tissue: Secondary | ICD-10-CM | POA: Diagnosis not present

## 2012-05-20 DIAGNOSIS — H35359 Cystoid macular degeneration, unspecified eye: Secondary | ICD-10-CM | POA: Diagnosis not present

## 2012-05-20 DIAGNOSIS — H43819 Vitreous degeneration, unspecified eye: Secondary | ICD-10-CM | POA: Diagnosis not present

## 2012-05-20 DIAGNOSIS — H348392 Tributary (branch) retinal vein occlusion, unspecified eye, stable: Secondary | ICD-10-CM | POA: Diagnosis not present

## 2012-05-21 DIAGNOSIS — M7981 Nontraumatic hematoma of soft tissue: Secondary | ICD-10-CM | POA: Diagnosis not present

## 2012-05-21 DIAGNOSIS — M79609 Pain in unspecified limb: Secondary | ICD-10-CM | POA: Diagnosis not present

## 2012-05-21 DIAGNOSIS — I8310 Varicose veins of unspecified lower extremity with inflammation: Secondary | ICD-10-CM | POA: Diagnosis not present

## 2012-06-15 DIAGNOSIS — M1990 Unspecified osteoarthritis, unspecified site: Secondary | ICD-10-CM | POA: Diagnosis not present

## 2012-06-15 DIAGNOSIS — E039 Hypothyroidism, unspecified: Secondary | ICD-10-CM | POA: Diagnosis not present

## 2012-06-15 DIAGNOSIS — E669 Obesity, unspecified: Secondary | ICD-10-CM | POA: Diagnosis not present

## 2012-06-15 DIAGNOSIS — E785 Hyperlipidemia, unspecified: Secondary | ICD-10-CM | POA: Diagnosis not present

## 2012-06-16 DIAGNOSIS — M79609 Pain in unspecified limb: Secondary | ICD-10-CM | POA: Diagnosis not present

## 2012-06-17 DIAGNOSIS — H43819 Vitreous degeneration, unspecified eye: Secondary | ICD-10-CM | POA: Diagnosis not present

## 2012-06-17 DIAGNOSIS — H348392 Tributary (branch) retinal vein occlusion, unspecified eye, stable: Secondary | ICD-10-CM | POA: Diagnosis not present

## 2012-06-17 DIAGNOSIS — H35359 Cystoid macular degeneration, unspecified eye: Secondary | ICD-10-CM | POA: Diagnosis not present

## 2012-07-15 DIAGNOSIS — H35359 Cystoid macular degeneration, unspecified eye: Secondary | ICD-10-CM | POA: Diagnosis not present

## 2012-07-15 DIAGNOSIS — H43819 Vitreous degeneration, unspecified eye: Secondary | ICD-10-CM | POA: Diagnosis not present

## 2012-07-15 DIAGNOSIS — H35379 Puckering of macula, unspecified eye: Secondary | ICD-10-CM | POA: Diagnosis not present

## 2012-07-15 DIAGNOSIS — H348392 Tributary (branch) retinal vein occlusion, unspecified eye, stable: Secondary | ICD-10-CM | POA: Diagnosis not present

## 2012-08-06 DIAGNOSIS — L570 Actinic keratosis: Secondary | ICD-10-CM | POA: Diagnosis not present

## 2012-08-06 DIAGNOSIS — D235 Other benign neoplasm of skin of trunk: Secondary | ICD-10-CM | POA: Diagnosis not present

## 2012-08-06 DIAGNOSIS — Z85828 Personal history of other malignant neoplasm of skin: Secondary | ICD-10-CM | POA: Diagnosis not present

## 2012-08-06 DIAGNOSIS — L820 Inflamed seborrheic keratosis: Secondary | ICD-10-CM | POA: Diagnosis not present

## 2012-08-17 DIAGNOSIS — H43819 Vitreous degeneration, unspecified eye: Secondary | ICD-10-CM | POA: Diagnosis not present

## 2012-08-17 DIAGNOSIS — H35359 Cystoid macular degeneration, unspecified eye: Secondary | ICD-10-CM | POA: Diagnosis not present

## 2012-08-17 DIAGNOSIS — H35379 Puckering of macula, unspecified eye: Secondary | ICD-10-CM | POA: Diagnosis not present

## 2012-08-17 DIAGNOSIS — H348392 Tributary (branch) retinal vein occlusion, unspecified eye, stable: Secondary | ICD-10-CM | POA: Diagnosis not present

## 2012-08-24 DIAGNOSIS — N764 Abscess of vulva: Secondary | ICD-10-CM | POA: Diagnosis not present

## 2012-09-01 DIAGNOSIS — E11311 Type 2 diabetes mellitus with unspecified diabetic retinopathy with macular edema: Secondary | ICD-10-CM | POA: Diagnosis not present

## 2012-09-29 DIAGNOSIS — N764 Abscess of vulva: Secondary | ICD-10-CM | POA: Diagnosis not present

## 2012-10-08 DIAGNOSIS — H52229 Regular astigmatism, unspecified eye: Secondary | ICD-10-CM | POA: Diagnosis not present

## 2012-10-08 DIAGNOSIS — H524 Presbyopia: Secondary | ICD-10-CM | POA: Diagnosis not present

## 2012-10-08 DIAGNOSIS — H33009 Unspecified retinal detachment with retinal break, unspecified eye: Secondary | ICD-10-CM | POA: Diagnosis not present

## 2012-10-08 DIAGNOSIS — H5210 Myopia, unspecified eye: Secondary | ICD-10-CM | POA: Diagnosis not present

## 2012-10-13 DIAGNOSIS — E669 Obesity, unspecified: Secondary | ICD-10-CM | POA: Diagnosis not present

## 2012-10-13 DIAGNOSIS — N760 Acute vaginitis: Secondary | ICD-10-CM | POA: Diagnosis not present

## 2012-10-13 DIAGNOSIS — E039 Hypothyroidism, unspecified: Secondary | ICD-10-CM | POA: Diagnosis not present

## 2012-10-15 DIAGNOSIS — J209 Acute bronchitis, unspecified: Secondary | ICD-10-CM | POA: Diagnosis not present

## 2012-10-15 DIAGNOSIS — J3089 Other allergic rhinitis: Secondary | ICD-10-CM | POA: Diagnosis not present

## 2012-12-08 DIAGNOSIS — H43819 Vitreous degeneration, unspecified eye: Secondary | ICD-10-CM | POA: Diagnosis not present

## 2012-12-08 DIAGNOSIS — H348392 Tributary (branch) retinal vein occlusion, unspecified eye, stable: Secondary | ICD-10-CM | POA: Diagnosis not present

## 2012-12-08 DIAGNOSIS — H35359 Cystoid macular degeneration, unspecified eye: Secondary | ICD-10-CM | POA: Diagnosis not present

## 2012-12-14 ENCOUNTER — Other Ambulatory Visit (HOSPITAL_COMMUNITY): Payer: Self-pay | Admitting: Pulmonary Disease

## 2012-12-14 DIAGNOSIS — Z139 Encounter for screening, unspecified: Secondary | ICD-10-CM

## 2012-12-16 DIAGNOSIS — E785 Hyperlipidemia, unspecified: Secondary | ICD-10-CM | POA: Diagnosis not present

## 2012-12-16 DIAGNOSIS — M1990 Unspecified osteoarthritis, unspecified site: Secondary | ICD-10-CM | POA: Diagnosis not present

## 2012-12-16 DIAGNOSIS — E039 Hypothyroidism, unspecified: Secondary | ICD-10-CM | POA: Diagnosis not present

## 2012-12-17 DIAGNOSIS — E785 Hyperlipidemia, unspecified: Secondary | ICD-10-CM | POA: Diagnosis not present

## 2012-12-17 DIAGNOSIS — E039 Hypothyroidism, unspecified: Secondary | ICD-10-CM | POA: Diagnosis not present

## 2012-12-17 DIAGNOSIS — M1990 Unspecified osteoarthritis, unspecified site: Secondary | ICD-10-CM | POA: Diagnosis not present

## 2012-12-22 ENCOUNTER — Ambulatory Visit (HOSPITAL_COMMUNITY)
Admission: RE | Admit: 2012-12-22 | Discharge: 2012-12-22 | Disposition: A | Discharge status: Home / Self Care | Payer: Medicare Other | Source: Ambulatory Visit | Attending: Pulmonary Disease | Admitting: Pulmonary Disease

## 2012-12-22 DIAGNOSIS — Z1231 Encounter for screening mammogram for malignant neoplasm of breast: Secondary | ICD-10-CM | POA: Insufficient documentation

## 2012-12-22 DIAGNOSIS — Z139 Encounter for screening, unspecified: Secondary | ICD-10-CM

## 2012-12-24 DIAGNOSIS — D2330 Other benign neoplasm of skin of unspecified part of face: Secondary | ICD-10-CM | POA: Diagnosis not present

## 2012-12-24 DIAGNOSIS — L570 Actinic keratosis: Secondary | ICD-10-CM | POA: Diagnosis not present

## 2012-12-24 DIAGNOSIS — D235 Other benign neoplasm of skin of trunk: Secondary | ICD-10-CM | POA: Diagnosis not present

## 2012-12-24 DIAGNOSIS — Z85828 Personal history of other malignant neoplasm of skin: Secondary | ICD-10-CM | POA: Diagnosis not present

## 2013-04-06 DIAGNOSIS — H43819 Vitreous degeneration, unspecified eye: Secondary | ICD-10-CM | POA: Diagnosis not present

## 2013-04-06 DIAGNOSIS — H348392 Tributary (branch) retinal vein occlusion, unspecified eye, stable: Secondary | ICD-10-CM | POA: Diagnosis not present

## 2013-04-06 DIAGNOSIS — H35359 Cystoid macular degeneration, unspecified eye: Secondary | ICD-10-CM | POA: Diagnosis not present

## 2013-04-14 DIAGNOSIS — M5137 Other intervertebral disc degeneration, lumbosacral region: Secondary | ICD-10-CM | POA: Diagnosis not present

## 2013-04-14 DIAGNOSIS — M545 Low back pain: Secondary | ICD-10-CM | POA: Diagnosis not present

## 2013-04-14 DIAGNOSIS — M999 Biomechanical lesion, unspecified: Secondary | ICD-10-CM | POA: Diagnosis not present

## 2013-04-15 DIAGNOSIS — M545 Low back pain: Secondary | ICD-10-CM | POA: Diagnosis not present

## 2013-04-15 DIAGNOSIS — M999 Biomechanical lesion, unspecified: Secondary | ICD-10-CM | POA: Diagnosis not present

## 2013-04-15 DIAGNOSIS — M5137 Other intervertebral disc degeneration, lumbosacral region: Secondary | ICD-10-CM | POA: Diagnosis not present

## 2013-04-16 DIAGNOSIS — M5137 Other intervertebral disc degeneration, lumbosacral region: Secondary | ICD-10-CM | POA: Diagnosis not present

## 2013-04-16 DIAGNOSIS — M999 Biomechanical lesion, unspecified: Secondary | ICD-10-CM | POA: Diagnosis not present

## 2013-04-16 DIAGNOSIS — M545 Low back pain: Secondary | ICD-10-CM | POA: Diagnosis not present

## 2013-04-19 DIAGNOSIS — M5137 Other intervertebral disc degeneration, lumbosacral region: Secondary | ICD-10-CM | POA: Diagnosis not present

## 2013-04-19 DIAGNOSIS — M545 Low back pain: Secondary | ICD-10-CM | POA: Diagnosis not present

## 2013-04-19 DIAGNOSIS — M999 Biomechanical lesion, unspecified: Secondary | ICD-10-CM | POA: Diagnosis not present

## 2013-04-23 DIAGNOSIS — M5137 Other intervertebral disc degeneration, lumbosacral region: Secondary | ICD-10-CM | POA: Diagnosis not present

## 2013-04-23 DIAGNOSIS — M999 Biomechanical lesion, unspecified: Secondary | ICD-10-CM | POA: Diagnosis not present

## 2013-04-23 DIAGNOSIS — M545 Low back pain: Secondary | ICD-10-CM | POA: Diagnosis not present

## 2013-04-27 DIAGNOSIS — M999 Biomechanical lesion, unspecified: Secondary | ICD-10-CM | POA: Diagnosis not present

## 2013-04-27 DIAGNOSIS — M5137 Other intervertebral disc degeneration, lumbosacral region: Secondary | ICD-10-CM | POA: Diagnosis not present

## 2013-04-27 DIAGNOSIS — M545 Low back pain: Secondary | ICD-10-CM | POA: Diagnosis not present

## 2013-05-03 DIAGNOSIS — M545 Low back pain: Secondary | ICD-10-CM | POA: Diagnosis not present

## 2013-05-03 DIAGNOSIS — M999 Biomechanical lesion, unspecified: Secondary | ICD-10-CM | POA: Diagnosis not present

## 2013-05-03 DIAGNOSIS — M5137 Other intervertebral disc degeneration, lumbosacral region: Secondary | ICD-10-CM | POA: Diagnosis not present

## 2013-05-10 DIAGNOSIS — M545 Low back pain: Secondary | ICD-10-CM | POA: Diagnosis not present

## 2013-05-10 DIAGNOSIS — L578 Other skin changes due to chronic exposure to nonionizing radiation: Secondary | ICD-10-CM | POA: Diagnosis not present

## 2013-05-10 DIAGNOSIS — M999 Biomechanical lesion, unspecified: Secondary | ICD-10-CM | POA: Diagnosis not present

## 2013-05-10 DIAGNOSIS — L570 Actinic keratosis: Secondary | ICD-10-CM | POA: Diagnosis not present

## 2013-05-10 DIAGNOSIS — M5137 Other intervertebral disc degeneration, lumbosacral region: Secondary | ICD-10-CM | POA: Diagnosis not present

## 2013-05-10 DIAGNOSIS — Z85828 Personal history of other malignant neoplasm of skin: Secondary | ICD-10-CM | POA: Diagnosis not present

## 2013-05-10 DIAGNOSIS — L739 Follicular disorder, unspecified: Secondary | ICD-10-CM | POA: Diagnosis not present

## 2013-05-17 DIAGNOSIS — M545 Low back pain: Secondary | ICD-10-CM | POA: Diagnosis not present

## 2013-05-17 DIAGNOSIS — M5137 Other intervertebral disc degeneration, lumbosacral region: Secondary | ICD-10-CM | POA: Diagnosis not present

## 2013-05-17 DIAGNOSIS — M999 Biomechanical lesion, unspecified: Secondary | ICD-10-CM | POA: Diagnosis not present

## 2013-05-24 DIAGNOSIS — M999 Biomechanical lesion, unspecified: Secondary | ICD-10-CM | POA: Diagnosis not present

## 2013-05-24 DIAGNOSIS — M5137 Other intervertebral disc degeneration, lumbosacral region: Secondary | ICD-10-CM | POA: Diagnosis not present

## 2013-05-24 DIAGNOSIS — M545 Low back pain: Secondary | ICD-10-CM | POA: Diagnosis not present

## 2013-06-14 DIAGNOSIS — M1990 Unspecified osteoarthritis, unspecified site: Secondary | ICD-10-CM | POA: Diagnosis not present

## 2013-06-14 DIAGNOSIS — E039 Hypothyroidism, unspecified: Secondary | ICD-10-CM | POA: Diagnosis not present

## 2013-06-14 DIAGNOSIS — E785 Hyperlipidemia, unspecified: Secondary | ICD-10-CM | POA: Diagnosis not present

## 2013-09-13 DIAGNOSIS — M5137 Other intervertebral disc degeneration, lumbosacral region: Secondary | ICD-10-CM | POA: Diagnosis not present

## 2013-09-13 DIAGNOSIS — M999 Biomechanical lesion, unspecified: Secondary | ICD-10-CM | POA: Diagnosis not present

## 2013-09-13 DIAGNOSIS — M545 Low back pain, unspecified: Secondary | ICD-10-CM | POA: Diagnosis not present

## 2013-10-20 ENCOUNTER — Other Ambulatory Visit (HOSPITAL_COMMUNITY): Payer: Self-pay | Admitting: Pulmonary Disease

## 2013-10-20 ENCOUNTER — Ambulatory Visit (HOSPITAL_COMMUNITY)
Admission: RE | Admit: 2013-10-20 | Discharge: 2013-10-20 | Disposition: A | Discharge status: Home / Self Care | Payer: Medicare Other | Source: Ambulatory Visit | Attending: Pulmonary Disease | Admitting: Pulmonary Disease

## 2013-10-20 DIAGNOSIS — M7989 Other specified soft tissue disorders: Secondary | ICD-10-CM | POA: Diagnosis not present

## 2013-10-20 DIAGNOSIS — S92919A Unspecified fracture of unspecified toe(s), initial encounter for closed fracture: Secondary | ICD-10-CM | POA: Diagnosis not present

## 2013-10-20 DIAGNOSIS — X58XXXA Exposure to other specified factors, initial encounter: Secondary | ICD-10-CM | POA: Insufficient documentation

## 2013-11-09 DIAGNOSIS — H348392 Tributary (branch) retinal vein occlusion, unspecified eye, stable: Secondary | ICD-10-CM | POA: Diagnosis not present

## 2013-11-09 DIAGNOSIS — H35359 Cystoid macular degeneration, unspecified eye: Secondary | ICD-10-CM | POA: Diagnosis not present

## 2013-11-16 ENCOUNTER — Other Ambulatory Visit (HOSPITAL_COMMUNITY): Payer: Self-pay | Admitting: Pulmonary Disease

## 2013-11-16 DIAGNOSIS — Z1231 Encounter for screening mammogram for malignant neoplasm of breast: Secondary | ICD-10-CM

## 2013-11-17 DIAGNOSIS — E785 Hyperlipidemia, unspecified: Secondary | ICD-10-CM | POA: Diagnosis not present

## 2013-11-17 DIAGNOSIS — E039 Hypothyroidism, unspecified: Secondary | ICD-10-CM | POA: Diagnosis not present

## 2013-12-08 DIAGNOSIS — M545 Low back pain, unspecified: Secondary | ICD-10-CM | POA: Diagnosis not present

## 2013-12-08 DIAGNOSIS — M999 Biomechanical lesion, unspecified: Secondary | ICD-10-CM | POA: Diagnosis not present

## 2013-12-08 DIAGNOSIS — M5137 Other intervertebral disc degeneration, lumbosacral region: Secondary | ICD-10-CM | POA: Diagnosis not present

## 2013-12-23 ENCOUNTER — Inpatient Hospital Stay (HOSPITAL_COMMUNITY): Admission: RE | Admit: 2013-12-23 | Payer: Medicare Other | Source: Ambulatory Visit

## 2013-12-28 ENCOUNTER — Ambulatory Visit (HOSPITAL_COMMUNITY)
Admission: RE | Admit: 2013-12-28 | Discharge: 2013-12-28 | Disposition: A | Discharge status: Home / Self Care | Payer: Medicare Other | Source: Ambulatory Visit | Attending: Pulmonary Disease | Admitting: Pulmonary Disease

## 2013-12-28 DIAGNOSIS — Z1231 Encounter for screening mammogram for malignant neoplasm of breast: Secondary | ICD-10-CM | POA: Diagnosis not present

## 2014-01-06 DIAGNOSIS — E039 Hypothyroidism, unspecified: Secondary | ICD-10-CM | POA: Diagnosis not present

## 2014-01-06 DIAGNOSIS — I499 Cardiac arrhythmia, unspecified: Secondary | ICD-10-CM | POA: Diagnosis not present

## 2014-01-06 DIAGNOSIS — E785 Hyperlipidemia, unspecified: Secondary | ICD-10-CM | POA: Diagnosis not present

## 2014-01-06 DIAGNOSIS — M1990 Unspecified osteoarthritis, unspecified site: Secondary | ICD-10-CM | POA: Diagnosis not present

## 2014-01-07 ENCOUNTER — Ambulatory Visit (INDEPENDENT_AMBULATORY_CARE_PROVIDER_SITE_OTHER): Payer: Medicare Other | Admitting: Cardiovascular Disease

## 2014-01-07 ENCOUNTER — Encounter: Payer: Self-pay | Admitting: Cardiovascular Disease

## 2014-01-07 ENCOUNTER — Encounter: Payer: Self-pay | Admitting: *Deleted

## 2014-01-07 VITALS — BP 132/62 | HR 70 | Ht 64.0 in | Wt 198.0 lb

## 2014-01-07 DIAGNOSIS — I498 Other specified cardiac arrhythmias: Secondary | ICD-10-CM | POA: Diagnosis not present

## 2014-01-07 DIAGNOSIS — I499 Cardiac arrhythmia, unspecified: Secondary | ICD-10-CM | POA: Insufficient documentation

## 2014-01-07 DIAGNOSIS — I4510 Unspecified right bundle-branch block: Secondary | ICD-10-CM | POA: Diagnosis not present

## 2014-01-07 DIAGNOSIS — R001 Bradycardia, unspecified: Secondary | ICD-10-CM

## 2014-01-07 DIAGNOSIS — E039 Hypothyroidism, unspecified: Secondary | ICD-10-CM | POA: Insufficient documentation

## 2014-01-07 NOTE — Progress Notes (Signed)
Patient ID: Claudia Wiggins, female   DOB: 1934-05-23, 80 y.o.   MRN: [id number]       CARDIOLOGY CONSULT NOTE  Patient ID: Claudia Wiggins MRN: [id number] DOB/AGE: 1933/11/14 80 y.o.  Admit date: (Not on file) Primary Physician HAWKINS,EDWARD L, MD  Reason for Consultation: bradycardia  HPI: The patient is a 80 year old woman who has a history of hypothyroidism and is referred for a history of bradycardia. She also has a history of hyperlipidemia. Blood test to assess her thyroid and lipids were reportedly normal in the recent past. An ECG performed on 11/08/2013 demonstrated sinus bradycardia, heart rate 54 beats per minute, with a right bundle branch block. She moved here from [place] about 10 years ago. She was noted to have a slow heart rate in the postoperative period approximately 20 years ago. She denies syncope and her most recent near syncopal episode was approximately 20 years ago as well. She denies chest pain and palpitations. If she is overly active in the kitchen she may get lightheaded. If she overexerts herself she may get short of breath. She denies orthopnea, leg swelling, and paroxysmal nocturnal dyspnea. She was told she has sleep apnea in the distant past, and reportedly underwent a sleep study. She has never used CPAP. When her husband was alive (passed away 2 years ago), he told her she snored and stopped breathing in her sleep.  Soc: Widowed. 4 children (2 sons, 2 daughters).  No Known Allergies  Current Outpatient Prescriptions  Medication Sig Dispense Refill  . desoximetasone (TOPICORT) 0.25 % cream Apply 1 application topically 2 (two) times daily.      . fexofenadine-pseudoephedrine (ALLEGRA-D 24) 180-240 MG per 24 hr tablet Take 1 tablet by mouth daily.      Marland Kitchen levothyroxine (SYNTHROID, LEVOTHROID) 25 MCG tablet Take 25 mcg by mouth daily.      Marland Kitchen PRAVASTATIN SODIUM PO Take 40 mg by mouth daily.        No current facility-administered medications for this visit.      Past Medical History  Diagnosis Date  . Hyperlipidemia   . Acid reflux   . Allergic rhinitis     Past Surgical History  Procedure Laterality Date  . Tonsillectomy    . Eye surgery    . Bladder lift    . Nasal sinus surgery    . Tubal ligation    . Partial hysterectomy    . Cesarean section      x 4    History   Social History  . Marital Status: Married    Spouse Name: N/A    Number of Children: N/A  . Years of Education: N/A   Occupational History  . Not on file.   Social History Main Topics  . Smoking status: Never Smoker   . Smokeless tobacco: Not on file  . Alcohol Use: No  . Drug Use: No  . Sexual Activity: Not on file   Other Topics Concern  . Not on file   Social History Narrative  . No narrative on file     No family history of premature CAD in 1st degree relatives.  Prior to Admission medications   Medication Sig Start Date End Date Taking? Authorizing Provider  desoximetasone (TOPICORT) 0.25 % cream Apply 1 application topically 2 (two) times daily.    Historical Provider, MD  fexofenadine-pseudoephedrine (ALLEGRA-D 24) 180-240 MG per 24 hr tablet Take 1 tablet by mouth daily.    Historical Provider, MD  levothyroxine (  SYNTHROID, LEVOTHROID) 25 MCG tablet Take 25 mcg by mouth daily.    Historical Provider, MD  PRAVASTATIN SODIUM PO Take 40 mg by mouth daily.     Historical Provider, MD     Review of systems complete and found to be negative unless listed above in HPI     Physical exam  BP 132/62  Pulse 70   General: NAD Neck: No JVD, no thyromegaly or thyroid nodule.  Lungs: Clear to auscultation bilaterally with normal respiratory effort. CV: Nondisplaced PMI.  Heart regular S1/S2, no S3/S4, no murmur.  No peripheral edema.  No carotid bruit.  Normal pedal pulses.  Abdomen: Soft, nontender, no hepatosplenomegaly, no distention.  Skin: Intact without lesions or rashes.  Neurologic: Alert and oriented x 3.  Psych: Normal  affect. Extremities: No clubbing or cyanosis.  HEENT: Normal.   Labs:   Lab Results  Component Value Date   WBC 8.3 10/12/2010   HGB 12.6 10/17/2010   HCT 39.4 10/17/2010   MCV 90.6 10/12/2010   PLT 277 10/12/2010   No results found for this basename: NA, K, CL, CO2, BUN, CREATININE, CALCIUM, LABALBU, PROT, BILITOT, ALKPHOS, ALT, AST, GLUCOSE,  in the last 168 hours Lab Results  Component Value Date   CKTOTAL 60 07/12/2008    Lab Results  Component Value Date   CHOL 268* 09/15/2008   CHOL 267* 01/26/2007   Lab Results  Component Value Date   HDL 48 09/15/2008   HDL 43 01/26/2007   Lab Results  Component Value Date   LDLCALC 180* 09/15/2008   LDLCALC 184* 01/26/2007   Lab Results  Component Value Date   TRIG 199* 09/15/2008   TRIG 202* 01/26/2007   Lab Results  Component Value Date   CHOLHDL 5.6 Ratio 09/15/2008   CHOLHDL 6.2 Ratio 01/26/2007   No results found for this basename: LDLDIRECT         Studies: No results found.  ASSESSMENT AND PLAN:  1. Bradycardia: Her HR is normal as is her rhythm today. She denies any recent near syncopal or syncopal episodes. She stays active taking care of 2 dogs. She has an underlying RBBB. She appears to have untreated sleep apnea. I would consider a sleep study, although she currently denies morning headaches and premature fatigue. I did inform her that sleep apnea can lead to both bradycardia and atrial arrhythmias. She is not on any rate-slowing agents. For the time being, I recommend continued clinical observation. Her thyroid studies were reportedly normal.  Dispo: f/u 1 year.  Signed: Kate Sable, M.D., F.A.C.C.  01/07/2014, 2:24 PM

## 2014-01-07 NOTE — Patient Instructions (Signed)
Your physician wants you to follow-up in: 1 year You will receive a reminder letter in the mail two months in advance. If you don't receive a letter, please call our office to schedule the follow-up appointment.    Your physician recommends that you continue on your current medications as directed. Please refer to the Current Medication list given to you today.     Thank you for choosing [facility] !  

## 2014-02-08 DIAGNOSIS — E785 Hyperlipidemia, unspecified: Secondary | ICD-10-CM | POA: Diagnosis not present

## 2014-02-08 DIAGNOSIS — E039 Hypothyroidism, unspecified: Secondary | ICD-10-CM | POA: Diagnosis not present

## 2014-02-08 DIAGNOSIS — M1990 Unspecified osteoarthritis, unspecified site: Secondary | ICD-10-CM | POA: Diagnosis not present

## 2014-03-02 DIAGNOSIS — H6690 Otitis media, unspecified, unspecified ear: Secondary | ICD-10-CM | POA: Diagnosis not present

## 2014-03-07 DIAGNOSIS — J209 Acute bronchitis, unspecified: Secondary | ICD-10-CM | POA: Diagnosis not present

## 2014-03-15 ENCOUNTER — Encounter: Payer: Self-pay | Admitting: Gastroenterology

## 2014-03-23 DIAGNOSIS — M545 Low back pain, unspecified: Secondary | ICD-10-CM | POA: Diagnosis not present

## 2014-03-23 DIAGNOSIS — M546 Pain in thoracic spine: Secondary | ICD-10-CM | POA: Diagnosis not present

## 2014-03-23 DIAGNOSIS — M999 Biomechanical lesion, unspecified: Secondary | ICD-10-CM | POA: Diagnosis not present

## 2014-04-13 DIAGNOSIS — E785 Hyperlipidemia, unspecified: Secondary | ICD-10-CM | POA: Diagnosis not present

## 2014-04-13 DIAGNOSIS — E039 Hypothyroidism, unspecified: Secondary | ICD-10-CM | POA: Diagnosis not present

## 2014-04-13 DIAGNOSIS — M1990 Unspecified osteoarthritis, unspecified site: Secondary | ICD-10-CM | POA: Diagnosis not present

## 2014-04-19 ENCOUNTER — Ambulatory Visit: Payer: Medicare Other | Admitting: Gastroenterology

## 2014-05-10 DIAGNOSIS — H34832 Tributary (branch) retinal vein occlusion, left eye: Secondary | ICD-10-CM | POA: Diagnosis not present

## 2014-05-10 DIAGNOSIS — H43812 Vitreous degeneration, left eye: Secondary | ICD-10-CM | POA: Diagnosis not present

## 2014-05-10 DIAGNOSIS — H35352 Cystoid macular degeneration, left eye: Secondary | ICD-10-CM | POA: Diagnosis not present

## 2014-05-17 ENCOUNTER — Ambulatory Visit: Payer: Medicare Other | Admitting: Gastroenterology

## 2014-05-18 DIAGNOSIS — M546 Pain in thoracic spine: Secondary | ICD-10-CM | POA: Diagnosis not present

## 2014-05-18 DIAGNOSIS — M5137 Other intervertebral disc degeneration, lumbosacral region: Secondary | ICD-10-CM | POA: Diagnosis not present

## 2014-05-18 DIAGNOSIS — M9903 Segmental and somatic dysfunction of lumbar region: Secondary | ICD-10-CM | POA: Diagnosis not present

## 2014-05-18 DIAGNOSIS — M9902 Segmental and somatic dysfunction of thoracic region: Secondary | ICD-10-CM | POA: Diagnosis not present

## 2014-06-07 DIAGNOSIS — E039 Hypothyroidism, unspecified: Secondary | ICD-10-CM | POA: Diagnosis not present

## 2014-06-17 ENCOUNTER — Other Ambulatory Visit: Payer: Self-pay

## 2014-06-17 ENCOUNTER — Ambulatory Visit (INDEPENDENT_AMBULATORY_CARE_PROVIDER_SITE_OTHER): Payer: Medicare Other | Admitting: Gastroenterology

## 2014-06-17 ENCOUNTER — Encounter: Payer: Self-pay | Admitting: Gastroenterology

## 2014-06-17 VITALS — BP 137/79 | HR 76 | Temp 98.0°F | Ht 64.5 in | Wt 197.0 lb

## 2014-06-17 DIAGNOSIS — Z86010 Personal history of colonic polyps: Secondary | ICD-10-CM | POA: Insufficient documentation

## 2014-06-17 MED ORDER — PEG-KCL-NACL-NASULF-NA ASC-C 100 G PO SOLR: 1.0000 | ORAL | Status: DC

## 2014-06-17 NOTE — Assessment & Plan Note (Addendum)
History of colon polyps with  last colonoscopy 2010 with tubulovillous adenoma and with recommended return for 5 year surveillance colonoscopy  No concerning lower GI symptoms.  Vague mild dysphagia to overly dry solid foods only without alarm features.  Continue daily OTC PPI, if any changes or development of alarm features offer EGD.  Proceed with colonoscopy with Dr. Oneida Alar in the near future. The risks, benefits, and alternatives have been discussed in detail with the patient. They state understanding and desire to proceed.

## 2014-06-17 NOTE — Patient Instructions (Addendum)
1. We will set you up with a surveilance colonoscopy with Dr. Oneida Alar. 2. Further recommendations pending procedure results.

## 2014-06-17 NOTE — Progress Notes (Signed)
Primary Care Physician:  Alonza Bogus, MD Primary Gastroenterologist:  Dr. Barney Drain  Chief Complaint  Patient presents with  . Colon Cancer Screening    HPI:   Last colonoscopy 2010 with 2-59mm rectal polyp removed and patho showed tubulovillous adenoma. Here for recommended 5 year surveilance colonoscopy.  Has a RBBB followed by cardiology last visit 6/5 of this year and shows stable presentation continue to follow per cardiology.  Since then has been doing well. Has a history of GERD. Has had an EGD 15 years ago. Is currently taking Prilosec OTC and between that and avoiding aggravating foods her symptoms are generally well controlled. Occasional mild dysphagia with overly dry foods which she feels is well controlled. Denies N/V/D. Occasional constipation with inadequate water intake, otherwise soft formed stool. Denies abdominal pain, melena, hematochezia. No changes in bowel habits.  Denies chest pain, shortness of breath. Occasional non-symptomatic palpitations for a few beats which self-resolve.  Past Medical History  Diagnosis Date  . Hyperlipidemia   . Acid reflux   . Allergic rhinitis     Past Surgical History  Procedure Laterality Date  . Tonsillectomy    . Eye surgery    . Bladder lift    . Nasal sinus surgery    . Tubal ligation    . Partial hysterectomy    . Cesarean section      x 4  . Colonoscopy   12/29/2008      GEX:BMWUX and rectosigmoid polyps/Large internal hemorrhoids. Polyps tubulovillous adenoma on path.    Current Outpatient Prescriptions  Medication Sig Dispense Refill  . fexofenadine-pseudoephedrine (ALLEGRA-D 24) 180-240 MG per 24 hr tablet Take 1 tablet by mouth daily.    Marland Kitchen levothyroxine (SYNTHROID, LEVOTHROID) 25 MCG tablet Take 25 mcg by mouth daily.    Marland Kitchen omeprazole (PRILOSEC OTC) 20 MG tablet Take 20 mg by mouth daily.    Marland Kitchen PRAVASTATIN SODIUM PO Take 40 mg by mouth daily.      No current facility-administered medications for  this visit.    Allergies as of 06/17/2014  . (No Known Allergies)    Family History  Problem Relation Age of Onset  . Arthritis    . Cancer    . Colon cancer Neg Hx     History   Social History  . Marital Status: Married    Spouse Name: N/A    Number of Children: N/A  . Years of Education: N/A   Occupational History  . Not on file.   Social History Main Topics  . Smoking status: Never Smoker   . Smokeless tobacco: Not on file  . Alcohol Use: No  . Drug Use: No  . Sexual Activity: Not on file   Other Topics Concern  . Not on file   Social History Narrative    Review of Systems: Gen: See HPI otherwise normal CV: See HPI otherwise normal Resp: Denies shortness of breath at rest or with exertion. Denies wheezing or cough.  GI: Denies odynophagia. Dysphagia as mentioned in HPI. Denies jaundice, hematemesis, fecal incontinence. Derm: Denies rash, itching, dry skin Psych: Denies depression, anxiety, memory loss, and confusion  Physical Exam: BP 137/79 mmHg  Pulse 76  Temp(Src) 98 F (36.7 C) (Oral)  Ht 5' 4.5" (1.638 m)  Wt 197 lb (89.359 kg)  BMI 33.31 kg/m2 General:   Alert and oriented. Pleasant and cooperative. Well-nourished and well-developed.  Head:  Normocephalic and atraumatic. Mouth:  No deformity or lesions, oral mucosa pink. No  oropharyngeal edema or obstruction.  Neck:  Supple, without mass or thyromegaly. Lungs:  Clear to auscultation bilaterally. No wheezes, rales, or rhonchi. No distress.  Heart:  S1, S2 present without murmurs appreciated.  Abdomen:  +BS, soft, non-tender and non-distended. No HSM noted. No guarding or rebound. No masses appreciated.  Rectal:  Deferred  Msk:  Symmetrical without gross deformities. Normal posture. Extremities:  Without edema. Neurologic:  Alert and  oriented x4;  grossly normal neurologically. Skin:  Intact without significant lesions or rashes. Cervical Nodes:  No significant cervical adenopathy. Psych:   Alert and cooperative. Normal mood and affect.     06/17/2014 11:24 AM

## 2014-06-22 NOTE — Progress Notes (Signed)
REVIEWED-NO ADDITIONAL RECOMMENDATIONS. 

## 2014-06-23 ENCOUNTER — Telehealth: Payer: Self-pay

## 2014-06-23 NOTE — Telephone Encounter (Signed)
-----   Message from Orvil Feil, NP sent at 06/23/2014  2:00 PM EST ----- I'm not sure what fruit popsicles she means. I can send in nausea medicine for her to take with the moviprep. OR we could try prepopik maybe?  ----- Message -----    From: Marlou Porch, CMA    Sent: 06/23/2014   8:01 AM      To: Orvil Feil, NP  Please help  Claudia Wiggins    ----- Message -----    From: Danie Binder, MD    Sent: 06/22/2014   5:08 PM      To: Marlou Porch, CMA  Pt seen by ANNA. ASK HER, THX! ----- Message -----    From: Marlou Porch, CMA    Sent: 06/17/2014  11:31 AM      To: Danie Binder, MD  Patient is unable to drink any of the preps for a TCS because she throws it back up everytime . She said last time she did the fruit popsicles. She is schedule for 07/15/14 @ 1:45. Please advise what we can use for her.    Claudia Wiggins

## 2014-06-23 NOTE — Telephone Encounter (Signed)
I tried to call with no answer 

## 2014-06-27 ENCOUNTER — Other Ambulatory Visit: Payer: Self-pay

## 2014-06-27 DIAGNOSIS — Z1211 Encounter for screening for malignant neoplasm of colon: Secondary | ICD-10-CM

## 2014-06-27 MED ORDER — ONDANSETRON HCL 4 MG PO TABS: 4.0000 mg | ORAL_TABLET | Freq: Three times a day (TID) | ORAL | Status: DC | PRN

## 2014-06-27 MED ORDER — PEG-KCL-NACL-NASULF-NA ASC-C 100 G PO SOLR: 1.0000 | ORAL | Status: DC

## 2014-06-27 NOTE — Telephone Encounter (Signed)
Talked with patient and informed her that we could send in her some nausea medication with the movieprep. She stated that she will try but does not think it will work because her system can not handle it.. She is set up for her TCS on 07/15/14 @ 145. Please send in the nausea medication.

## 2014-06-27 NOTE — Telephone Encounter (Signed)
Zofran has been sent in to take every 8 hours as needed for nausea.

## 2014-06-29 NOTE — Progress Notes (Signed)
cc'ed to pcp °

## 2014-07-03 NOTE — Telephone Encounter (Signed)
PLEASE CALL PT. SHE CAN TRY MIRALAX PREP. ON DEC 9, SHE SHOULD FOLLOW A FULL LIQUID DIET  SEND PT INFO BELOW. ON DEC 10, START CLEAR LIQUIDS DRINK  MIRALAX 17 GMS IN 1 CUP OF LIQUID  EVERY HOUR FROM 12N TO 6PM. DRINK 1 CUP OF LIQUID 30 MINUTES AFTER EACH DOSE: 1230P TO 630PM. TAKE DULCOLAX 10 MG AT 3 PM AND 5 PM.   Full Liquid Diet A high-calorie, high-protein supplement should be used to meet your nutritional requirements when the full liquid diet is continued for more than 2 or 3 days. If this diet is to be used for an extended period of time (more than 7 days), a multivitamin should be considered.  Breads and Starches  Allowed: None are allowed except crackers WHOLE OR pureed (made into a thick, smooth soup) in soup.   Avoid: Any others.    Potatoes/Pasta/Rice  Allowed: ANY ITEM AS A SOUP OR SMALL PLATE OF MASHED POTATOES.       Vegetables  Allowed: Strained tomato or vegetable juice. Vegetables pureed in soup.   Avoid: Any others.    Fruit  Allowed: Any strained fruit juices and fruit drinks. Include 1 serving of citrus or vitamin C-enriched fruit juice daily.   Avoid: Any others.  Meat and Meat Substitutes  Allowed: Egg  Avoid: Any meat, fish, or fowl. All cheese.  Milk  Allowed: Milk beverages, including milk shakes and instant breakfast mixes. Smooth yogurt.   Avoid: Any others. Avoid dairy products if not tolerated.    Soups and Combination Foods  Allowed: Broth, strained cream soups. Strained, broth-based soups.   Avoid: Any others.    Desserts and Sweets  Allowed: flavored gelatin,plain ice cream, sherbet, smooth pudding, junket, fruit ices, frozen ice pops, pudding pops,, frozen fudge pops, chocolate syrup. Sugar, honey, jelly, syrup.   Avoid: Any others.  Fats and Oils  Allowed: Margarine, butter, cream, sour cream, oils.   Avoid: Any others.  Beverages  Allowed: All.   Avoid: None.  Condiments  Allowed: Iodized salt, pepper, spices,  flavorings. Cocoa powder.   Avoid: Any others.    SAMPLE MEAL PLAN Breakfast   cup orange juice.   1 OR 2 EGGS   1 cup  milk.   1 cup beverage (coffee or tea).   Cream or sugar, if desired.    Midmorning Snack  2 SCRAMBLED OR HARD BOILED EGG   Lunch  1 cup cream soup.    cup fruit juice.   1 cup milk.    cup custard.   1 cup beverage (coffee or tea).   Cream or sugar, if desired.    Midafternoon Snack  1 cup milk shake.  Dinner  1 cup cream soup.    cup fruit juice.   1 cup milk.    cup pudding.   1 cup beverage (coffee or tea).   Cream or sugar, if desired.  Evening Snack  1 cup supplement.  To increase calories, add sugar, cream, butter, or margarine if possible. Nutritional supplements will also increase the total calories.

## 2014-07-04 NOTE — Telephone Encounter (Signed)
Called patient to let her know about the new prep and she said that she was not going to have the TCS done at all now.

## 2014-07-04 NOTE — Telephone Encounter (Signed)
Noted  

## 2014-07-05 NOTE — Telephone Encounter (Signed)
REVIEWED-NO ADDITIONAL RECOMMENDATIONS. 

## 2014-07-11 DIAGNOSIS — M9903 Segmental and somatic dysfunction of lumbar region: Secondary | ICD-10-CM | POA: Diagnosis not present

## 2014-07-11 DIAGNOSIS — M9902 Segmental and somatic dysfunction of thoracic region: Secondary | ICD-10-CM | POA: Diagnosis not present

## 2014-07-11 DIAGNOSIS — M546 Pain in thoracic spine: Secondary | ICD-10-CM | POA: Diagnosis not present

## 2014-07-11 DIAGNOSIS — M5137 Other intervertebral disc degeneration, lumbosacral region: Secondary | ICD-10-CM | POA: Diagnosis not present

## 2014-07-15 ENCOUNTER — Encounter (HOSPITAL_COMMUNITY): Admission: RE | Payer: Self-pay | Source: Ambulatory Visit

## 2014-07-15 ENCOUNTER — Ambulatory Visit (HOSPITAL_COMMUNITY): Admission: RE | Admit: 2014-07-15 | Payer: Medicare Other | Source: Ambulatory Visit | Admitting: Gastroenterology

## 2014-07-15 DIAGNOSIS — M5137 Other intervertebral disc degeneration, lumbosacral region: Secondary | ICD-10-CM | POA: Diagnosis not present

## 2014-07-15 DIAGNOSIS — M546 Pain in thoracic spine: Secondary | ICD-10-CM | POA: Diagnosis not present

## 2014-07-15 DIAGNOSIS — M9903 Segmental and somatic dysfunction of lumbar region: Secondary | ICD-10-CM | POA: Diagnosis not present

## 2014-07-15 DIAGNOSIS — M9902 Segmental and somatic dysfunction of thoracic region: Secondary | ICD-10-CM | POA: Diagnosis not present

## 2014-07-15 SURGERY — COLONOSCOPY
Anesthesia: Moderate Sedation

## 2014-07-18 DIAGNOSIS — M9902 Segmental and somatic dysfunction of thoracic region: Secondary | ICD-10-CM | POA: Diagnosis not present

## 2014-07-18 DIAGNOSIS — M9903 Segmental and somatic dysfunction of lumbar region: Secondary | ICD-10-CM | POA: Diagnosis not present

## 2014-07-18 DIAGNOSIS — M546 Pain in thoracic spine: Secondary | ICD-10-CM | POA: Diagnosis not present

## 2014-07-18 DIAGNOSIS — M5137 Other intervertebral disc degeneration, lumbosacral region: Secondary | ICD-10-CM | POA: Diagnosis not present

## 2014-07-20 DIAGNOSIS — M9902 Segmental and somatic dysfunction of thoracic region: Secondary | ICD-10-CM | POA: Diagnosis not present

## 2014-07-20 DIAGNOSIS — M5137 Other intervertebral disc degeneration, lumbosacral region: Secondary | ICD-10-CM | POA: Diagnosis not present

## 2014-07-20 DIAGNOSIS — M9903 Segmental and somatic dysfunction of lumbar region: Secondary | ICD-10-CM | POA: Diagnosis not present

## 2014-07-20 DIAGNOSIS — M546 Pain in thoracic spine: Secondary | ICD-10-CM | POA: Diagnosis not present

## 2014-07-27 DIAGNOSIS — M546 Pain in thoracic spine: Secondary | ICD-10-CM | POA: Diagnosis not present

## 2014-07-27 DIAGNOSIS — M9903 Segmental and somatic dysfunction of lumbar region: Secondary | ICD-10-CM | POA: Diagnosis not present

## 2014-07-27 DIAGNOSIS — M9902 Segmental and somatic dysfunction of thoracic region: Secondary | ICD-10-CM | POA: Diagnosis not present

## 2014-07-27 DIAGNOSIS — M5137 Other intervertebral disc degeneration, lumbosacral region: Secondary | ICD-10-CM | POA: Diagnosis not present

## 2014-08-16 DIAGNOSIS — E785 Hyperlipidemia, unspecified: Secondary | ICD-10-CM | POA: Diagnosis not present

## 2014-08-16 DIAGNOSIS — E039 Hypothyroidism, unspecified: Secondary | ICD-10-CM | POA: Diagnosis not present

## 2014-08-16 DIAGNOSIS — M129 Arthropathy, unspecified: Secondary | ICD-10-CM | POA: Diagnosis not present

## 2014-10-24 DIAGNOSIS — M129 Arthropathy, unspecified: Secondary | ICD-10-CM | POA: Diagnosis not present

## 2014-10-24 DIAGNOSIS — E039 Hypothyroidism, unspecified: Secondary | ICD-10-CM | POA: Diagnosis not present

## 2014-10-24 DIAGNOSIS — E785 Hyperlipidemia, unspecified: Secondary | ICD-10-CM | POA: Diagnosis not present

## 2014-11-18 ENCOUNTER — Other Ambulatory Visit (HOSPITAL_COMMUNITY): Payer: Self-pay | Admitting: Pulmonary Disease

## 2014-11-18 DIAGNOSIS — Z1231 Encounter for screening mammogram for malignant neoplasm of breast: Secondary | ICD-10-CM

## 2014-12-09 DIAGNOSIS — E785 Hyperlipidemia, unspecified: Secondary | ICD-10-CM | POA: Diagnosis not present

## 2014-12-09 DIAGNOSIS — E039 Hypothyroidism, unspecified: Secondary | ICD-10-CM | POA: Diagnosis not present

## 2014-12-09 DIAGNOSIS — M129 Arthropathy, unspecified: Secondary | ICD-10-CM | POA: Diagnosis not present

## 2015-01-04 ENCOUNTER — Ambulatory Visit (HOSPITAL_COMMUNITY): Payer: Federal, State, Local not specified - PPO

## 2015-01-04 ENCOUNTER — Ambulatory Visit (HOSPITAL_COMMUNITY)
Admission: RE | Admit: 2015-01-04 | Discharge: 2015-01-04 | Disposition: A | Discharge status: Home / Self Care | Payer: Medicare Other | Source: Ambulatory Visit | Attending: Pulmonary Disease | Admitting: Pulmonary Disease

## 2015-01-04 DIAGNOSIS — Z1231 Encounter for screening mammogram for malignant neoplasm of breast: Secondary | ICD-10-CM | POA: Insufficient documentation

## 2015-01-05 ENCOUNTER — Other Ambulatory Visit: Payer: Self-pay | Admitting: Pulmonary Disease

## 2015-01-05 DIAGNOSIS — R928 Other abnormal and inconclusive findings on diagnostic imaging of breast: Secondary | ICD-10-CM

## 2015-01-12 ENCOUNTER — Ambulatory Visit: Payer: Federal, State, Local not specified - PPO | Admitting: Cardiovascular Disease

## 2015-01-17 ENCOUNTER — Encounter (HOSPITAL_COMMUNITY): Payer: Medicare Other

## 2015-01-24 ENCOUNTER — Ambulatory Visit (HOSPITAL_COMMUNITY)
Admission: RE | Admit: 2015-01-24 | Discharge: 2015-01-24 | Disposition: A | Discharge status: Home / Self Care | Payer: Medicare Other | Source: Ambulatory Visit | Attending: Pulmonary Disease | Admitting: Pulmonary Disease

## 2015-01-24 DIAGNOSIS — R928 Other abnormal and inconclusive findings on diagnostic imaging of breast: Secondary | ICD-10-CM | POA: Diagnosis not present

## 2015-02-14 ENCOUNTER — Ambulatory Visit (INDEPENDENT_AMBULATORY_CARE_PROVIDER_SITE_OTHER): Payer: Medicare Other | Admitting: Cardiovascular Disease

## 2015-02-14 ENCOUNTER — Encounter: Payer: Self-pay | Admitting: Cardiovascular Disease

## 2015-02-14 VITALS — BP 122/78 | HR 85 | Ht 64.0 in | Wt 193.8 lb

## 2015-02-14 DIAGNOSIS — I4510 Unspecified right bundle-branch block: Secondary | ICD-10-CM

## 2015-02-14 DIAGNOSIS — Z136 Encounter for screening for cardiovascular disorders: Secondary | ICD-10-CM | POA: Diagnosis not present

## 2015-02-14 DIAGNOSIS — R001 Bradycardia, unspecified: Secondary | ICD-10-CM | POA: Diagnosis not present

## 2015-02-14 NOTE — Patient Instructions (Signed)
Your physician recommends that you schedule a follow-up appointment in: as needed     Your physician recommends that you continue on your current medications as directed. Please refer to the Current Medication list given to you today.   Thank you for choosing [facility] !         

## 2015-02-14 NOTE — Progress Notes (Signed)
Patient ID: Claudia Wiggins, female   DOB: January 08, 1934, 81 y.o.   MRN: [id number]      SUBJECTIVE: The patient returns for follow-up of bradycardia.   ECG performed in the office today demonstrates normal sinus rhythm, heart rate 76 bpm, with an underlying right bundle branch block.   She denies being hospitalized in the past 12 months. She denies chest pain, shortness of breath, palpitations, lightheadedness, dizziness, and leg swelling.   Soc: She has children in [place], [place], and [place]. Her husband passed away 2 years ago. She lives with her dogs.   Review of Systems: As per "subjective", otherwise negative.  No Known Allergies  Current Outpatient Prescriptions  Medication Sig Dispense Refill  . fexofenadine-pseudoephedrine (ALLEGRA-D 24) 180-240 MG per 24 hr tablet Take 1 tablet by mouth daily.    Marland Kitchen levothyroxine (SYNTHROID, LEVOTHROID) 25 MCG tablet Take 25 mcg by mouth daily.    Marland Kitchen omeprazole (PRILOSEC OTC) 20 MG tablet Take 20 mg by mouth daily.    . ondansetron (ZOFRAN) 4 MG tablet Take 1 tablet (4 mg total) by mouth every 8 (eight) hours as needed for nausea or vomiting. 30 tablet 0  . peg 3350 powder (MOVIPREP) 100 G SOLR Take 1 kit (200 g total) by mouth as directed. 1 kit 0  . pravastatin (PRAVACHOL) 40 MG tablet Take 40 mg by mouth daily.     No current facility-administered medications for this visit.    Past Medical History  Diagnosis Date  . Hyperlipidemia   . Acid reflux   . Allergic rhinitis     Past Surgical History  Procedure Laterality Date  . Tonsillectomy    . Eye surgery    . Bladder lift    . Nasal sinus surgery    . Tubal ligation    . Partial hysterectomy    . Cesarean section      x 4  . Colonoscopy   12/29/2008      ASN:KNLZJ and rectosigmoid polyps/Large internal hemorrhoids. Polyps tubulovillous adenoma on path.    History   Social History  . Marital Status: Married    Spouse Name: N/A  . Number of Children: N/A  . Years  of Education: N/A   Occupational History  . Not on file.   Social History Main Topics  . Smoking status: Never Smoker   . Smokeless tobacco: Not on file  . Alcohol Use: No  . Drug Use: No  . Sexual Activity: Not on file   Other Topics Concern  . Not on file   Social History Narrative     Filed Vitals:   02/14/15 1120  BP: 122/78  Pulse: 85  Height: _0  (1.626 m)  Weight: 193 lb 12.8 oz (87.907 kg)  SpO2: 96%    PHYSICAL EXAM General: NAD HEENT: Normal. Neck: No JVD, no thyromegaly. Lungs: Clear to auscultation bilaterally with normal respiratory effort. CV: Nondisplaced PMI.  Regular rate and rhythm, normal S1/S2, no S3/S4, no murmur. No pretibial or periankle edema.  No carotid bruit.  Normal pedal pulses.  Abdomen: Soft, nontender, no hepatosplenomegaly, no distention.  Neurologic: Alert and oriented x 3.  Psych: Normal affect. Skin: Normal. Musculoskeletal: Normal range of motion, no gross deformities. Extremities: No clubbing or cyanosis.   ECG: Most recent ECG reviewed.      ASSESSMENT AND PLAN: 1. Bradycardia and RBBB: HR is normal. She is asymptomatic. No testing is indicated.  Dispo: f/u prn  Kate Sable, M.D., F.A.C.C.

## 2015-03-15 DIAGNOSIS — M129 Arthropathy, unspecified: Secondary | ICD-10-CM | POA: Diagnosis not present

## 2015-03-15 DIAGNOSIS — E039 Hypothyroidism, unspecified: Secondary | ICD-10-CM | POA: Diagnosis not present

## 2015-03-15 DIAGNOSIS — E785 Hyperlipidemia, unspecified: Secondary | ICD-10-CM | POA: Diagnosis not present

## 2015-04-19 DIAGNOSIS — Z23 Encounter for immunization: Secondary | ICD-10-CM | POA: Diagnosis not present

## 2015-05-08 DIAGNOSIS — E039 Hypothyroidism, unspecified: Secondary | ICD-10-CM | POA: Diagnosis not present

## 2015-05-08 DIAGNOSIS — E785 Hyperlipidemia, unspecified: Secondary | ICD-10-CM | POA: Diagnosis not present

## 2015-05-09 DIAGNOSIS — H34832 Tributary (branch) retinal vein occlusion, left eye, with macular edema: Secondary | ICD-10-CM | POA: Diagnosis not present

## 2015-05-09 DIAGNOSIS — H43812 Vitreous degeneration, left eye: Secondary | ICD-10-CM | POA: Diagnosis not present

## 2015-06-07 DIAGNOSIS — H34832 Tributary (branch) retinal vein occlusion, left eye, with macular edema: Secondary | ICD-10-CM | POA: Diagnosis not present

## 2015-07-23 DIAGNOSIS — H43812 Vitreous degeneration, left eye: Secondary | ICD-10-CM | POA: Diagnosis not present

## 2015-07-23 DIAGNOSIS — H34832 Tributary (branch) retinal vein occlusion, left eye, with macular edema: Secondary | ICD-10-CM | POA: Diagnosis not present

## 2015-07-31 ENCOUNTER — Emergency Department (HOSPITAL_COMMUNITY)
Admission: EM | Admit: 2015-07-31 | Discharge: 2015-07-31 | Disposition: A | Discharge status: Home / Self Care | Payer: Medicare Other | Attending: Emergency Medicine | Admitting: Emergency Medicine

## 2015-07-31 ENCOUNTER — Encounter (HOSPITAL_COMMUNITY): Payer: Self-pay | Admitting: Emergency Medicine

## 2015-07-31 DIAGNOSIS — R42 Dizziness and giddiness: Secondary | ICD-10-CM | POA: Insufficient documentation

## 2015-07-31 DIAGNOSIS — Z7982 Long term (current) use of aspirin: Secondary | ICD-10-CM | POA: Diagnosis not present

## 2015-07-31 DIAGNOSIS — R109 Unspecified abdominal pain: Secondary | ICD-10-CM | POA: Insufficient documentation

## 2015-07-31 DIAGNOSIS — R197 Diarrhea, unspecified: Secondary | ICD-10-CM | POA: Diagnosis not present

## 2015-07-31 DIAGNOSIS — E785 Hyperlipidemia, unspecified: Secondary | ICD-10-CM | POA: Insufficient documentation

## 2015-07-31 DIAGNOSIS — Z8679 Personal history of other diseases of the circulatory system: Secondary | ICD-10-CM | POA: Insufficient documentation

## 2015-07-31 DIAGNOSIS — Z79899 Other long term (current) drug therapy: Secondary | ICD-10-CM | POA: Diagnosis not present

## 2015-07-31 DIAGNOSIS — K219 Gastro-esophageal reflux disease without esophagitis: Secondary | ICD-10-CM | POA: Diagnosis not present

## 2015-07-31 DIAGNOSIS — R404 Transient alteration of awareness: Secondary | ICD-10-CM | POA: Diagnosis not present

## 2015-07-31 HISTORY — DX: Unspecified right bundle-branch block: I45.10

## 2015-07-31 LAB — COMPREHENSIVE METABOLIC PANEL
ALT: 18 U/L (ref 14–54)
AST: 23 U/L (ref 15–41)
Albumin: 3.8 g/dL (ref 3.5–5.0)
Alkaline Phosphatase: 120 U/L (ref 38–126)
Anion gap: 6 (ref 5–15)
BUN: 18 mg/dL (ref 6–20)
CO2: 27 mmol/L (ref 22–32)
Calcium: 8.9 mg/dL (ref 8.9–10.3)
Chloride: 106 mmol/L (ref 101–111)
Creatinine, Ser: 0.8 mg/dL (ref 0.44–1.00)
GFR calc Af Amer: >60 mL/min (ref >60)
GFR calc non Af Amer: >60 mL/min (ref >60)
Glucose, Bld: 125 mg/dL — ABNORMAL HIGH (ref 65–99)
Potassium: 3.8 mmol/L (ref 3.5–5.1)
Sodium: 139 mmol/L (ref 135–145)
Total Bilirubin: 0.4 mg/dL (ref 0.3–1.2)
Total Protein: 6.2 g/dL — ABNORMAL LOW (ref 6.5–8.1)

## 2015-07-31 LAB — URINALYSIS, ROUTINE W REFLEX MICROSCOPIC
Bilirubin Urine: NEGATIVE
Glucose, UA: NEGATIVE mg/dL
Hgb urine dipstick: NEGATIVE
Ketones, ur: NEGATIVE mg/dL
Nitrite: NEGATIVE
Protein, ur: NEGATIVE mg/dL
Specific Gravity, Urine: 1.01 (ref 1.005–1.030)
pH: 7 (ref 5.0–8.0)

## 2015-07-31 LAB — URINE MICROSCOPIC-ADD ON
Bacteria, UA: NONE SEEN
RBC / HPF: NONE SEEN RBC/hpf (ref 0–5)

## 2015-07-31 LAB — CBC
HCT: 43.2 % (ref 36.0–46.0)
Hemoglobin: 14.7 g/dL (ref 12.0–15.0)
MCH: 31.1 pg (ref 26.0–34.0)
MCHC: 34 g/dL (ref 30.0–36.0)
MCV: 91.5 fL (ref 78.0–100.0)
Platelets: 233 10*3/uL (ref 150–400)
RBC: 4.72 MIL/uL (ref 3.87–5.11)
RDW: 12.5 % (ref 11.5–15.5)
WBC: 5.6 10*3/uL (ref 4.0–10.5)

## 2015-07-31 LAB — LIPASE, BLOOD: Lipase: 28 U/L (ref 11–51)

## 2015-07-31 MED ORDER — MECLIZINE HCL 12.5 MG PO TABS
25.0000 mg | ORAL_TABLET | Freq: Once | ORAL | Status: AC
  Administered 2015-07-31: 25 mg via ORAL
  Filled 2015-07-31: qty 2

## 2015-07-31 MED ORDER — MECLIZINE HCL 50 MG PO TABS: 25.0000 mg | ORAL_TABLET | Freq: Three times a day (TID) | ORAL | Status: AC | PRN

## 2015-07-31 NOTE — Discharge Instructions (Signed)

## 2015-07-31 NOTE — ED Notes (Signed)
Patient arrives via EMS from home with c/o dizziness with standing that started this morning upon awakening. Denies headache. States abdominal pain in lower mid abdomen with nausea. States room feels like its spinning when she is standing. Alert, oriented x 4. NIH score 0 on arrival.

## 2015-07-31 NOTE — ED Notes (Signed)
Patient with no complaints at this time. Respirations even and unlabored. Skin warm/dry. Discharge instructions reviewed with patient at this time. Patient given opportunity to voice concerns/ask questions. IV removed per policy and band-aid applied to site. Patient discharged at this time and left Emergency Department with steady gait.  

## 2015-07-31 NOTE — ED Notes (Signed)
Pt ambulated without difficulty. States she feels slightly dizzy but not nearly as bad as when she arrived. Pt request coffee.

## 2015-07-31 NOTE — ED Notes (Signed)
Orthostatics done.  Pt had a difficult time standing due to the dizziness.  C/o lower abdominal pain.

## 2015-07-31 NOTE — ED Provider Notes (Signed)
History  By signing my name below, I, Claudia Wiggins, attest that this documentation has been prepared under the direction and in the presence of Claudia Manifold, MD. Electronically Signed: Marlowe Wiggins, ED Scribe. 07/31/2015. 9:42 AM  Chief Complaint  Patient presents with  . Dizziness   The history is provided by the patient and medical records. No language interpreter was used.    HPI Comments:  Claudia Wiggins is a 81 y.o. female brought in by EMS, who presents to the Emergency Department complaining of dizziness that began upon waking this morning. She reports associated diarrhea and sharp, lower abdominal pain. Daughter reports the pt being increasingly confused lately. Pt denies falling but states she had to grab on to something to prevent it. She states whenever she stands the spinning sensation starts. She has not done anything to treat the symptoms. Standing worsens the dizziness. Sitting or lying helps to alleviate it. She denies otalgia or tinnitus.   Past Medical History  Diagnosis Date  . Hyperlipidemia   . Acid reflux   . Allergic rhinitis   . Right bundle branch block    Past Surgical History  Procedure Laterality Date  . Tonsillectomy    . Eye surgery    . Bladder lift    . Nasal sinus surgery    . Tubal ligation    . Partial hysterectomy    . Cesarean section      x 4  . Colonoscopy   12/29/2008      [id number] and rectosigmoid polyps/Large internal hemorrhoids. Polyps tubulovillous adenoma on path.   Family History  Problem Relation Age of Onset  . Arthritis    . Cancer    . Colon cancer Neg Hx    Social History  Substance Use Topics  . Smoking status: Never Smoker   . Smokeless tobacco: None  . Alcohol Use: No   OB History    No data available     Review of Systems  HENT: Negative for ear pain and tinnitus.   Gastrointestinal: Positive for abdominal pain and diarrhea.  Neurological: Positive for dizziness.  All other systems reviewed and are  negative.   Allergies  Review of patient's allergies indicates no known allergies.  Home Medications   Prior to Admission medications   Medication Sig Start Date End Date Taking? Authorizing Provider  aspirin 325 MG EC tablet Take 325 mg by mouth daily.   Yes Historical Provider, MD  levothyroxine (SYNTHROID, LEVOTHROID) 25 MCG tablet Take 25 mcg by mouth daily.   Yes Historical Provider, MD  magnesium oxide (MAG-OX) 400 MG tablet Take 400 mg by mouth daily.   Yes Historical Provider, MD  omeprazole (PRILOSEC OTC) 20 MG tablet Take 20 mg by mouth daily as needed (heartburn).    Yes Historical Provider, MD  Potassium 99 MG TABS Take 1 tablet by mouth daily.   Yes Historical Provider, MD  pravastatin (PRAVACHOL) 40 MG tablet Take 40 mg by mouth daily.   Yes Historical Provider, MD  vitamin E 400 UNIT capsule Take 400 Units by mouth daily.   Yes Historical Provider, MD   Triage Vitals: BP 140/60 mmHg  Pulse 59  Temp(Src) 97.6 F (36.4 C) (Oral)  Resp 18  Ht 5\' 4"  (1.626 m)  Wt 193 lb (87.544 kg)  BMI 33.11 kg/m2  SpO2 98% Physical Exam  Constitutional: She is oriented to person, place, and time. She appears well-developed and well-nourished. No distress.  HENT:  Head: Normocephalic and atraumatic.  Eyes: EOM are normal.  Neck: Normal range of motion.  Cardiovascular: Normal rate, regular rhythm and normal heart sounds.   Pulmonary/Chest: Effort normal and breath sounds normal.  Abdominal: Soft. She exhibits no distension. There is no tenderness.  Musculoskeletal: Normal range of motion.  Neurological: She is alert and oriented to person, place, and time.  Good finger to nose bilaterally.  Skin: Skin is warm and dry.  Psychiatric: She has a normal mood and affect. Her behavior is normal. Judgment and thought content normal.  Nursing note and vitals reviewed.   ED Course  Procedures (including critical care time) DIAGNOSTIC STUDIES: Oxygen Saturation is 98% on RA, normal by  my interpretation.   COORDINATION OF CARE: 9:04 AM- Explained to patient her symptoms were likely due to vertigo. Will order urinalysis and medication for dizziness. Pt verbalizes understanding and agrees to plan.  Medications  meclizine (ANTIVERT) tablet 25 mg (25 mg Oral Given 07/31/15 0933)    Labs Review Labs Reviewed  COMPREHENSIVE METABOLIC PANEL - Abnormal; Notable for the following:    Glucose, Bld 125 (*)    Total Protein 6.2 (*)    All other components within normal limits  URINALYSIS, ROUTINE W REFLEX MICROSCOPIC (NOT AT [facility]) - Abnormal; Notable for the following:    Leukocytes, UA TRACE (*)    All other components within normal limits  URINE MICROSCOPIC-ADD ON - Abnormal; Notable for the following:    Squamous Epithelial / LPF 0-5 (*)    All other components within normal limits  LIPASE, BLOOD  CBC    Imaging Review No results found. I have personally reviewed and evaluated these images and lab results as part of my medical decision-making.   EKG Interpretation   Date/Time:  Monday July 31 2015 08:06:20 EST Ventricular Rate:  54 PR Interval:  179 QRS Duration: 156 QT Interval:  485 QTC Calculation: 460 R Axis:   108 Text Interpretation:  Sinus rhythm Right bundle branch block ED PHYSICIAN  INTERPRETATION AVAILABLE IN [facility] Confirmed by TEST, Record  ([id number]) on 08/01/2015 7:10:08 AM      MDM   Final diagnoses:  Vertigo    81 year old female with what she calls dizziness. She describes vertigo. Worsen with movement. Improved with rest. Nonfocal neuro exam. She is present peripherally etiology. No particular concerning features for central etiology. Plan symptomatic treatment with meclizine. Also some mild lower abdominal pain which is likely unrelated. Minimal suprapubic tenderness on exam. Will check urinalysis.  Feeling better. Ambulated w/o difficulty in ED. It has been determined that no acute conditions requiring further emergency  intervention are present at this time. The patient has been advised of the diagnosis and plan. I reviewed any labs and imaging including any potential incidental findings. We have discussed signs and symptoms that warrant return to the ED and they are listed in the discharge instructions. PRN meclizine.   I personally preformed the services scribed in my presence. The recorded information has been reviewed is accurate. Claudia Manifold, MD.     Claudia Manifold, MD 08/02/15 [phone]

## 2015-07-31 NOTE — ED Notes (Signed)
Up to bsc.  Dizziness better when getting up this time.  Denies any abdominal pain at this time.

## 2015-08-02 ENCOUNTER — Other Ambulatory Visit (HOSPITAL_COMMUNITY): Payer: Self-pay | Admitting: Pulmonary Disease

## 2015-08-02 DIAGNOSIS — R42 Dizziness and giddiness: Secondary | ICD-10-CM

## 2015-08-02 DIAGNOSIS — E785 Hyperlipidemia, unspecified: Secondary | ICD-10-CM | POA: Diagnosis not present

## 2015-08-02 DIAGNOSIS — R1084 Generalized abdominal pain: Secondary | ICD-10-CM

## 2015-08-02 DIAGNOSIS — E039 Hypothyroidism, unspecified: Secondary | ICD-10-CM | POA: Diagnosis not present

## 2015-08-02 DIAGNOSIS — R410 Disorientation, unspecified: Secondary | ICD-10-CM | POA: Diagnosis not present

## 2015-08-02 DIAGNOSIS — R1030 Lower abdominal pain, unspecified: Secondary | ICD-10-CM | POA: Diagnosis not present

## 2015-08-04 ENCOUNTER — Ambulatory Visit (HOSPITAL_COMMUNITY)
Admission: RE | Admit: 2015-08-04 | Discharge: 2015-08-04 | Disposition: A | Discharge status: Home / Self Care | Payer: Medicare Other | Source: Ambulatory Visit | Attending: Pulmonary Disease | Admitting: Pulmonary Disease

## 2015-08-04 ENCOUNTER — Ambulatory Visit (HOSPITAL_COMMUNITY): Admission: RE | Admit: 2015-08-04 | Payer: Medicare Other | Source: Ambulatory Visit

## 2015-08-04 DIAGNOSIS — R42 Dizziness and giddiness: Secondary | ICD-10-CM

## 2015-08-04 DIAGNOSIS — Z0000 Encounter for general adult medical examination without abnormal findings: Secondary | ICD-10-CM | POA: Diagnosis not present

## 2015-08-04 DIAGNOSIS — R471 Dysarthria and anarthria: Secondary | ICD-10-CM | POA: Diagnosis not present

## 2015-08-04 DIAGNOSIS — R1084 Generalized abdominal pain: Secondary | ICD-10-CM | POA: Insufficient documentation

## 2015-08-04 DIAGNOSIS — R410 Disorientation, unspecified: Secondary | ICD-10-CM | POA: Diagnosis not present

## 2015-08-04 DIAGNOSIS — K8020 Calculus of gallbladder without cholecystitis without obstruction: Secondary | ICD-10-CM | POA: Insufficient documentation

## 2015-08-04 DIAGNOSIS — Z90710 Acquired absence of both cervix and uterus: Secondary | ICD-10-CM | POA: Diagnosis not present

## 2015-08-04 DIAGNOSIS — R109 Unspecified abdominal pain: Secondary | ICD-10-CM | POA: Diagnosis not present

## 2015-08-04 DIAGNOSIS — E039 Hypothyroidism, unspecified: Secondary | ICD-10-CM | POA: Diagnosis not present

## 2015-08-04 DIAGNOSIS — E785 Hyperlipidemia, unspecified: Secondary | ICD-10-CM | POA: Diagnosis not present

## 2015-08-04 DIAGNOSIS — R1030 Lower abdominal pain, unspecified: Secondary | ICD-10-CM | POA: Diagnosis not present

## 2015-08-29 DIAGNOSIS — K8020 Calculus of gallbladder without cholecystitis without obstruction: Secondary | ICD-10-CM | POA: Diagnosis not present

## 2015-09-01 NOTE — H&P (Signed)
  NTS SOAP Note  Vital Signs:  Vitals as of: 99991111: Systolic 123XX123: Diastolic 66: Heart Rate 59: Temp 96.90F (Temporal): Height 97ft 4in: Weight 193Lbs 0 Ounces: Pain Level 5: BMI 33.13   BMI : 33.13 kg/m2  Subjective: This 82 year old female presents for of right upper quadrant discomfort.  Has been present for some time now.  U/S shows cholelithiasis, normal common bile duct.  Made worse with various foods.  No fever, chills, jaundice.  +bloating and nausea, no emesis.  Review of Symptoms:  Constitutional:unremarkable   h/o vertigo Eyes:unremarkable   Nose/Mouth/Throat:unremarkable Cardiovascular:  unremarkable Respiratory:unremarkable Gastrointestinabdominal pain Genitourinary:unremarkable   Musculoskeletal:unremarkable Skin:unremarkable Breast:unremarkable   Hematolgic/Lymphatic:unremarkable   Allergic/Immunologic:unremarkable   Past Medical History:  Reviewed  Past Medical History  Surgical History: bladder surgery, TAH Medical Problems: Right bundle branch block, migraine, hypothyroidism, high  cholesterol levels Allergies: asa Medications: levothyroxine, pravastatin, allegra   Social History:Reviewed  Social History  Preferred Language: English Race:  White Ethnicity: Not Hispanic / Latino Age: 82 year Marital Status:  M Alcohol: no   Smoking Status: Never smoker reviewed on 08/29/2015 Functional Status reviewed on 08/29/2015 ------------------------------------------------ Bathing: Normal Cooking: Normal Dressing: Normal Driving: Normal Eating: Normal Managing Meds: Normal Oral Care: Normal Shopping: Normal Toileting: Normal Transferring: Normal Walking: Normal Cognitive Status reviewed on 08/29/2015 ------------------------------------------------ Attention: Normal Decision Making: Normal Language: Normal Memory: Normal Motor: Normal Perception: Normal Problem Solving: Normal Visual and Spatial: Normal   Family  History:Reviewed  Family Health History Family History is Unknown    Objective Information: General:Well appearing, well nourished in no distress. Heart:RRR, no murmur or gallop.  Normal S1, S2.  No S3, S4.  Lungs:  CTA bilaterally, no wheezes, rhonchi, rales.  Breathing unlabored. Abdomen:Soft, NT/ND, no HSM, no masses.  Mild discomfort to palpation in right upper quadrant.  Assessment:Cholelithiasis  Diagnoses: 574.20  K80.20 Gallstone (Calculus of gallbladder without cholecystitis without obstruction)  Procedures: [id number] - OFFICE OUTPATIENT NEW 30 MINUTES    Plan:  Will call to schedule laparoscopic cholecystectomy.   Patient Education:Alternative treatments to surgery were discussed with patient (and family).  Risks and benefits  of procedure including bleeding, infection, hepaotbiliary injury, and the possibility of an open procedure were fully explained to the patient (and family) who gave informed consent. Patient/family questions were addressed.  Follow-up:Pending Surgery

## 2015-09-04 ENCOUNTER — Ambulatory Visit (HOSPITAL_COMMUNITY)
Admission: RE | Admit: 2015-09-04 | Discharge: 2015-09-04 | Disposition: A | Discharge status: Home / Self Care | Payer: Medicare Other | Source: Ambulatory Visit | Attending: General Surgery | Admitting: General Surgery

## 2015-09-04 ENCOUNTER — Encounter (HOSPITAL_COMMUNITY): Payer: Self-pay

## 2015-09-04 ENCOUNTER — Encounter (HOSPITAL_COMMUNITY)
Admission: RE | Admit: 2015-09-04 | Discharge: 2015-09-04 | Disposition: A | Discharge status: Home / Self Care | Payer: Medicare Other | Source: Ambulatory Visit | Attending: General Surgery | Admitting: General Surgery

## 2015-09-04 DIAGNOSIS — I517 Cardiomegaly: Secondary | ICD-10-CM | POA: Insufficient documentation

## 2015-09-04 DIAGNOSIS — J9811 Atelectasis: Secondary | ICD-10-CM | POA: Insufficient documentation

## 2015-09-04 DIAGNOSIS — Z01818 Encounter for other preprocedural examination: Secondary | ICD-10-CM | POA: Insufficient documentation

## 2015-09-04 HISTORY — DX: Alzheimer's disease, unspecified: G30.9

## 2015-09-04 HISTORY — DX: Dementia in other diseases classified elsewhere, unspecified severity, without behavioral disturbance, psychotic disturbance, mood disturbance, and anxiety: F02.80

## 2015-09-04 HISTORY — DX: Hypothyroidism, unspecified: E03.9

## 2015-09-04 HISTORY — DX: Dizziness and giddiness: R42

## 2015-09-04 LAB — BASIC METABOLIC PANEL
Anion gap: 9 (ref 5–15)
BUN: 20 mg/dL (ref 6–20)
CO2: 23 mmol/L (ref 22–32)
Calcium: 9.2 mg/dL (ref 8.9–10.3)
Chloride: 105 mmol/L (ref 101–111)
Creatinine, Ser: 0.81 mg/dL (ref 0.44–1.00)
GFR calc Af Amer: >60 mL/min (ref >60)
Glucose, Bld: 116 mg/dL — ABNORMAL HIGH (ref 65–99)
POTASSIUM: 4.1 mmol/L (ref 3.5–5.1)
SODIUM: 137 mmol/L (ref 135–145)

## 2015-09-04 LAB — CBC WITH DIFFERENTIAL/PLATELET
BASOS ABS: 0 10*3/uL (ref 0.0–0.1)
BASOS PCT: 0 %
EOS ABS: 0.2 10*3/uL (ref 0.0–0.7)
EOS PCT: 3 %
HCT: 43.9 % (ref 36.0–46.0)
Hemoglobin: 15 g/dL (ref 12.0–15.0)
LYMPHS PCT: 38 %
Lymphs Abs: 2.5 10*3/uL (ref 0.7–4.0)
MCH: 31.4 pg (ref 26.0–34.0)
MCHC: 34.2 g/dL (ref 30.0–36.0)
MCV: 91.8 fL (ref 78.0–100.0)
Monocytes Absolute: 0.6 10*3/uL (ref 0.1–1.0)
Monocytes Relative: 9 %
Neutro Abs: 3.3 10*3/uL (ref 1.7–7.7)
Neutrophils Relative %: 50 %
PLATELETS: 256 10*3/uL (ref 150–400)
RBC: 4.78 MIL/uL (ref 3.87–5.11)
RDW: 12.6 % (ref 11.5–15.5)
WBC: 6.6 10*3/uL (ref 4.0–10.5)

## 2015-09-04 LAB — HEPATIC FUNCTION PANEL
ALT: 19 U/L (ref 14–54)
AST: 21 U/L (ref 15–41)
Albumin: 3.9 g/dL (ref 3.5–5.0)
Alkaline Phosphatase: 134 U/L — ABNORMAL HIGH (ref 38–126)
BILIRUBIN DIRECT: 0.1 mg/dL (ref 0.1–0.5)
BILIRUBIN INDIRECT: 0.6 mg/dL (ref 0.3–0.9)
Total Bilirubin: 0.7 mg/dL (ref 0.3–1.2)
Total Protein: 6 g/dL — ABNORMAL LOW (ref 6.5–8.1)

## 2015-09-04 NOTE — Patient Instructions (Signed)
Claudia Wiggins  09/04/2015     @PREFPERIOPPHARMACY @   Your procedure is scheduled on  09/06/2015   Report to [facility] at  615   A.M.  Call this number if you have problems the morning of surgery:  [phone]   Remember:  Do not eat food or drink liquids after midnight.  Take these medicines the morning of surgery with A SIP OF WATER  Synthroid, prilosec, antivert.   Do not wear jewelry, make-up or nail polish.  Do not wear lotions, powders, or perfumes.  You may wear deodorant.  Do not shave 48 hours prior to surgery.  Men may shave face and neck.  Do not bring valuables to the hospital.  [facility] is not responsible for any belongings or valuables.  Contacts, dentures or bridgework may not be worn into surgery.  Leave your suitcase in the car.  After surgery it may be brought to your room.  For patients admitted to the hospital, discharge time will be determined by your treatment team.  Patients discharged the day of surgery will not be allowed to drive home.   Name and phone number of your driver:   family Special instructions:  none  Please read over the following fact sheets that you were given. Pain Booklet, Coughing and Deep Breathing, Surgical Site Infection Prevention, Anesthesia Post-op Instructions and Care and Recovery After Surgery      Laparoscopic Cholecystectomy Laparoscopic cholecystectomy is surgery to remove the gallbladder. The gallbladder is located in the upper right part of the abdomen, behind the liver. It is a storage sac for bile, which is produced in the liver. Bile aids in the digestion and absorption of fats. Cholecystectomy is often done for inflammation of the gallbladder (cholecystitis). This condition is usually caused by a buildup of gallstones (cholelithiasis) in the gallbladder. Gallstones can block the flow of bile, and that can result in inflammation and pain. In severe cases, emergency surgery may be required. If emergency  surgery is not required, you will have time to prepare for the procedure. Laparoscopic surgery is an alternative to open surgery. Laparoscopic surgery has a shorter recovery time. Your common bile duct may also need to be examined during the procedure. If stones are found in the common bile duct, they may be removed. LET [facility] CARE PROVIDER KNOW ABOUT:  Any allergies you have.  All medicines you are taking, including vitamins, herbs, eye drops, creams, and over-the-counter medicines.  Previous problems you or members of your family have had with the use of anesthetics.  Any blood disorders you have.  Previous surgeries you have had.  Any medical conditions you have. RISKS AND COMPLICATIONS Generally, this is a safe procedure. However, problems may occur, including:  Infection.  Bleeding.  Allergic reactions to medicines.  Damage to other structures or organs.  A stone remaining in the common bile duct.  A bile leak from the cyst duct that is clipped when your gallbladder is removed.  The need to convert to open surgery, which requires a larger incision in the abdomen. This may be necessary if your surgeon thinks that it is not safe to continue with a laparoscopic procedure. BEFORE THE PROCEDURE  Ask your health care provider about:  Changing or stopping your regular medicines. This is especially important if you are taking diabetes medicines or blood thinners.  Taking medicines such as aspirin and ibuprofen. These medicines can thin your blood. Do not  take these medicines before your procedure if your health care provider instructs you not to.  Follow instructions from your health care provider about eating or drinking restrictions.  Let your health care provider know if you develop a cold or an infection before surgery.  Plan to have someone take you home after the procedure.  Ask your health care provider how your surgical site will be marked or identified.  You  may be given antibiotic medicine to help prevent infection. PROCEDURE  To reduce your risk of infection:  Your health care team will wash or sanitize their hands.  Your skin will be washed with soap.  An IV tube may be inserted into one of your veins.  You will be given a medicine to make you fall asleep (general anesthetic).  A breathing tube will be placed in your mouth.  The surgeon will make several small cuts (incisions) in your abdomen.  A thin, lighted tube (laparoscope) that has a tiny camera on the end will be inserted through one of the small incisions. The camera on the laparoscope will send a picture to a TV screen (monitor) in the operating room. This will give the surgeon a good view inside your abdomen.  A gas will be pumped into your abdomen. This will expand your abdomen to give the surgeon more room to perform the surgery.  Other tools that are needed for the procedure will be inserted through the other incisions. The gallbladder will be removed through one of the incisions.  After your gallbladder has been removed, the incisions will be closed with stitches (sutures), staples, or skin glue.  Your incisions may be covered with a bandage (dressing). The procedure may vary among health care providers and hospitals. AFTER THE PROCEDURE  Your blood pressure, heart rate, breathing rate, and blood oxygen level will be monitored often until the medicines you were given have worn off.  You will be given medicines as needed to control your pain.   This information is not intended to replace advice given to you by your health care provider. Make sure you discuss any questions you have with your health care provider.   Document Released: 07/22/2005 Document Revised: 04/12/2015 Document Reviewed: 03/03/2013 Elsevier Interactive Patient Education 2016 [employer] Laparoscopic Cholecystectomy, Care After These instructions give you information about caring for yourself  after your procedure. Your doctor may also give you more specific instructions. Call your doctor if you have any problems or questions after your procedure. HOME CARE Incision Care  Follow instructions from your doctor about how to take care of your cuts from surgery (incisions). Make sure you:  Wash your hands with soap and water before you change your bandage (dressing). If you cannot use soap and water, use hand sanitizer.  Change your bandage as told by your doctor.  Leave stitches (sutures), skin glue, or skin tape (adhesive) strips in place. They may need to stay in place for 2 weeks or longer. If tape strips get loose and curl up, you may trim the loose edges. Do not remove tape strips completely unless your doctor says it is okay.  Do not take baths, swim, or use a hot tub until your doctor says it is okay. Ask your doctor if you can take showers. You may only be allowed to take sponge baths. General Instructions  Take over-the-counter and prescription medicines only as told by your doctor.  Do not drive or use heavy machinery while taking prescription pain medicine.  Return  to your normal diet as told by your doctor.  Do not lift anything that is heavier than 10 lb (4.5 kg).  Do not play contact sports for 1 week or until your doctor says it is okay. GET HELP IF:  You have redness, swelling, or pain at the site of your surgical cuts.  You have fluid, blood, or pus coming from your cuts.  You notice a bad smell coming from your cut area.  Your surgical cuts break open.  You have a fever. GET HELP RIGHT AWAY IF:   You have a rash.  You have trouble breathing.  You have chest pain.  You have pain in your shoulders (shoulder strap areas) that is getting worse.  You pass out (faint) or feel dizzy while you are standing.  You have very bad pain in your belly (abdomen).  You feel sick to your stomach (nauseous) or throw up (vomit) for more than 1 day.   This  information is not intended to replace advice given to you by your health care provider. Make sure you discuss any questions you have with your health care provider.   Document Released: 04/30/2008 Document Revised: 04/12/2015 Document Reviewed: 03/03/2013 Elsevier Interactive Patient Education 2016 [employer] Inc[employer] PATIENT INSTRUCTIONS POST-ANESTHESIA  IMMEDIATELY FOLLOWING SURGERY:  Do not drive or operate machinery for the first twenty four hours after surgery.  Do not make any important decisions for twenty four hours after surgery or while taking narcotic pain medications or sedatives.  If you develop intractable nausea and vomiting or a severe headache please notify your doctor immediately.  FOLLOW-UP:  Please make an appointment with your surgeon as instructed. You do not need to follow up with anesthesia unless specifically instructed to do so.  WOUND CARE INSTRUCTIONS (if applicable):  Keep a dry clean dressing on the anesthesia/puncture wound site if there is drainage.  Once the wound has quit draining you may leave it open to air.  Generally you should leave the bandage intact for twenty four hours unless there is drainage.  If the epidural site drains for more than 36-48 hours please call the anesthesia department.  QUESTIONS?:  Please feel free to call your physician or the hospital operator if you have any questions, and they will be happy to assist you.

## 2015-09-04 NOTE — Pre-Procedure Instructions (Signed)
Patient given information to sign up for my chart at home. 

## 2015-09-05 NOTE — OR Nursing (Signed)
Chest xray results reported to Dr. Patsey Berthold, no further orders given

## 2015-09-06 ENCOUNTER — Ambulatory Visit (HOSPITAL_COMMUNITY)
Admission: RE | Admit: 2015-09-06 | Discharge: 2015-09-06 | Disposition: A | Discharge status: Home / Self Care | Payer: Medicare Other | Source: Ambulatory Visit | Attending: General Surgery | Admitting: General Surgery

## 2015-09-06 ENCOUNTER — Encounter (HOSPITAL_COMMUNITY)
Admission: RE | Disposition: A | Discharge status: Home / Self Care | Payer: Self-pay | Source: Ambulatory Visit | Attending: General Surgery

## 2015-09-06 ENCOUNTER — Encounter (HOSPITAL_COMMUNITY): Payer: Self-pay | Admitting: *Deleted

## 2015-09-06 ENCOUNTER — Ambulatory Visit (HOSPITAL_COMMUNITY): Payer: Medicare Other | Admitting: Anesthesiology

## 2015-09-06 DIAGNOSIS — K8020 Calculus of gallbladder without cholecystitis without obstruction: Secondary | ICD-10-CM | POA: Diagnosis not present

## 2015-09-06 DIAGNOSIS — E7800 Pure hypercholesterolemia, unspecified: Secondary | ICD-10-CM | POA: Insufficient documentation

## 2015-09-06 DIAGNOSIS — K8010 Calculus of gallbladder with chronic cholecystitis without obstruction: Secondary | ICD-10-CM | POA: Insufficient documentation

## 2015-09-06 DIAGNOSIS — Z79899 Other long term (current) drug therapy: Secondary | ICD-10-CM | POA: Insufficient documentation

## 2015-09-06 DIAGNOSIS — K219 Gastro-esophageal reflux disease without esophagitis: Secondary | ICD-10-CM | POA: Insufficient documentation

## 2015-09-06 DIAGNOSIS — E039 Hypothyroidism, unspecified: Secondary | ICD-10-CM | POA: Diagnosis not present

## 2015-09-06 HISTORY — PX: CHOLECYSTECTOMY: SHX55

## 2015-09-06 SURGERY — LAPAROSCOPIC CHOLECYSTECTOMY
Anesthesia: General | Site: Abdomen

## 2015-09-06 MED ORDER — PROPOFOL 10 MG/ML IV BOLUS
INTRAVENOUS | Status: DC | PRN
  Administered 2015-09-06: 80 mg via INTRAVENOUS

## 2015-09-06 MED ORDER — BUPIVACAINE HCL (PF) 0.5 % IJ SOLN
INTRAMUSCULAR | Status: AC
  Filled 2015-09-06: qty 30

## 2015-09-06 MED ORDER — GLYCOPYRROLATE 0.2 MG/ML IJ SOLN
INTRAMUSCULAR | Status: DC | PRN
  Administered 2015-09-06: 0.6 mg via INTRAVENOUS

## 2015-09-06 MED ORDER — ROCURONIUM BROMIDE 100 MG/10ML IV SOLN
INTRAVENOUS | Status: DC | PRN
  Administered 2015-09-06: 20 mg via INTRAVENOUS

## 2015-09-06 MED ORDER — SODIUM CHLORIDE 0.9 % IR SOLN
Status: DC | PRN
  Administered 2015-09-06: 1000 mL

## 2015-09-06 MED ORDER — HEMOSTATIC AGENTS (NO CHARGE) OPTIME
TOPICAL | Status: DC | PRN
  Administered 2015-09-06: 1 via TOPICAL

## 2015-09-06 MED ORDER — SUCCINYLCHOLINE CHLORIDE 20 MG/ML IJ SOLN
INTRAMUSCULAR | Status: DC | PRN
  Administered 2015-09-06: 120 mg via INTRAVENOUS

## 2015-09-06 MED ORDER — DEXAMETHASONE SODIUM PHOSPHATE 4 MG/ML IJ SOLN
4.0000 mg | Freq: Once | INTRAMUSCULAR | Status: AC
  Administered 2015-09-06: 4 mg via INTRAVENOUS

## 2015-09-06 MED ORDER — GLYCOPYRROLATE 0.2 MG/ML IJ SOLN
INTRAMUSCULAR | Status: AC
  Filled 2015-09-06: qty 4

## 2015-09-06 MED ORDER — PROPOFOL 10 MG/ML IV BOLUS
INTRAVENOUS | Status: AC
  Filled 2015-09-06: qty 40

## 2015-09-06 MED ORDER — HYDROCODONE-ACETAMINOPHEN 5-325 MG PO TABS: 1.0000 | ORAL_TABLET | ORAL | Status: AC | PRN

## 2015-09-06 MED ORDER — KETOROLAC TROMETHAMINE 30 MG/ML IJ SOLN
INTRAMUSCULAR | Status: AC
  Filled 2015-09-06: qty 1

## 2015-09-06 MED ORDER — LIDOCAINE HCL (CARDIAC) 20 MG/ML IV SOLN
INTRAVENOUS | Status: DC | PRN
  Administered 2015-09-06: 20 mg via INTRAVENOUS

## 2015-09-06 MED ORDER — MIDAZOLAM HCL 2 MG/2ML IJ SOLN
1.0000 mg | INTRAMUSCULAR | Status: DC | PRN
  Administered 2015-09-06: 2 mg via INTRAVENOUS

## 2015-09-06 MED ORDER — EPHEDRINE SULFATE 50 MG/ML IJ SOLN
INTRAMUSCULAR | Status: AC
  Filled 2015-09-06: qty 1

## 2015-09-06 MED ORDER — BUPIVACAINE HCL (PF) 0.5 % IJ SOLN
INTRAMUSCULAR | Status: DC | PRN
  Administered 2015-09-06: 10 mL

## 2015-09-06 MED ORDER — ROCURONIUM BROMIDE 50 MG/5ML IV SOLN
INTRAVENOUS | Status: AC
  Filled 2015-09-06: qty 1

## 2015-09-06 MED ORDER — POVIDONE-IODINE 10 % EX OINT
TOPICAL_OINTMENT | CUTANEOUS | Status: AC
  Filled 2015-09-06: qty 1

## 2015-09-06 MED ORDER — DEXAMETHASONE SODIUM PHOSPHATE 4 MG/ML IJ SOLN
INTRAMUSCULAR | Status: AC
  Filled 2015-09-06: qty 1

## 2015-09-06 MED ORDER — NEOSTIGMINE METHYLSULFATE 10 MG/10ML IV SOLN
INTRAVENOUS | Status: AC
  Filled 2015-09-06: qty 1

## 2015-09-06 MED ORDER — NEOSTIGMINE METHYLSULFATE 10 MG/10ML IV SOLN
INTRAVENOUS | Status: DC | PRN
  Administered 2015-09-06: 4 mg via INTRAVENOUS

## 2015-09-06 MED ORDER — KETOROLAC TROMETHAMINE 30 MG/ML IJ SOLN
30.0000 mg | Freq: Once | INTRAMUSCULAR | Status: AC
  Administered 2015-09-06: 30 mg via INTRAVENOUS

## 2015-09-06 MED ORDER — ATROPINE SULFATE 0.4 MG/ML IJ SOLN
INTRAMUSCULAR | Status: DC | PRN
  Administered 2015-09-06: 0.4 mg via INTRAVENOUS

## 2015-09-06 MED ORDER — EPHEDRINE SULFATE 50 MG/ML IJ SOLN
INTRAMUSCULAR | Status: DC | PRN
Start: 2015-09-06 — End: 2015-09-06
  Administered 2015-09-06: 5 mg via INTRAVENOUS

## 2015-09-06 MED ORDER — ONDANSETRON HCL 4 MG/2ML IJ SOLN
4.0000 mg | Freq: Once | INTRAMUSCULAR | Status: AC | PRN
  Administered 2015-09-06: 4 mg via INTRAVENOUS
  Filled 2015-09-06: qty 2

## 2015-09-06 MED ORDER — ONDANSETRON HCL 4 MG/2ML IJ SOLN
INTRAMUSCULAR | Status: AC
  Filled 2015-09-06: qty 2

## 2015-09-06 MED ORDER — POVIDONE-IODINE 10 % OINT PACKET
TOPICAL_OINTMENT | CUTANEOUS | Status: DC | PRN
  Administered 2015-09-06: 1 via TOPICAL

## 2015-09-06 MED ORDER — MIDAZOLAM HCL 2 MG/2ML IJ SOLN
INTRAMUSCULAR | Status: AC
  Filled 2015-09-06: qty 2

## 2015-09-06 MED ORDER — ONDANSETRON HCL 4 MG/2ML IJ SOLN
4.0000 mg | Freq: Once | INTRAMUSCULAR | Status: AC
Start: 2015-09-06 — End: 2015-09-06
  Administered 2015-09-06: 4 mg via INTRAVENOUS

## 2015-09-06 MED ORDER — FENTANYL CITRATE (PF) 100 MCG/2ML IJ SOLN
INTRAMUSCULAR | Status: DC | PRN
  Administered 2015-09-06 (×5): 50 ug via INTRAVENOUS

## 2015-09-06 MED ORDER — SODIUM CHLORIDE 0.9 % IJ SOLN
INTRAMUSCULAR | Status: AC
  Filled 2015-09-06: qty 10

## 2015-09-06 MED ORDER — LACTATED RINGERS IV SOLN
INTRAVENOUS | Status: DC
  Administered 2015-09-06 (×2): via INTRAVENOUS

## 2015-09-06 MED ORDER — CIPROFLOXACIN IN D5W 400 MG/200ML IV SOLN
400.0000 mg | INTRAVENOUS | Status: AC
  Administered 2015-09-06: 400 mg via INTRAVENOUS
  Filled 2015-09-06: qty 200

## 2015-09-06 MED ORDER — CHLORHEXIDINE GLUCONATE 4 % EX LIQD: 1.0000 "application " | Freq: Once | CUTANEOUS | Status: DC

## 2015-09-06 MED ORDER — SUCCINYLCHOLINE CHLORIDE 20 MG/ML IJ SOLN
INTRAMUSCULAR | Status: AC
  Filled 2015-09-06: qty 1

## 2015-09-06 MED ORDER — FENTANYL CITRATE (PF) 250 MCG/5ML IJ SOLN
INTRAMUSCULAR | Status: AC
  Filled 2015-09-06: qty 5

## 2015-09-06 MED ORDER — FENTANYL CITRATE (PF) 100 MCG/2ML IJ SOLN
25.0000 ug | INTRAMUSCULAR | Status: DC | PRN
  Administered 2015-09-06: 25 ug via INTRAVENOUS
  Filled 2015-09-06: qty 2

## 2015-09-06 SURGICAL SUPPLY — 42 items
APPLIER CLIP LAPSCP 10X32 DD (CLIP) ×3 IMPLANT
BAG HAMPER (MISCELLANEOUS) ×3 IMPLANT
CHLORAPREP W/TINT 26ML (MISCELLANEOUS) ×3 IMPLANT
CLOTH BEACON ORANGE TIMEOUT ST (SAFETY) ×3 IMPLANT
COVER LIGHT HANDLE STERIS (MISCELLANEOUS) ×6 IMPLANT
DECANTER SPIKE VIAL GLASS SM (MISCELLANEOUS) ×3 IMPLANT
ELECT REM PT RETURN 9FT ADLT (ELECTROSURGICAL) ×3
ELECTRODE REM PT RTRN 9FT ADLT (ELECTROSURGICAL) ×1 IMPLANT
FILTER SMOKE EVAC LAPAROSHD (FILTER) ×3 IMPLANT
FORMALIN 10 PREFIL 120ML (MISCELLANEOUS) ×3 IMPLANT
GLOVE BIOGEL M 7.0 STRL (GLOVE) ×6 IMPLANT
GLOVE BIOGEL PI IND STRL 7.0 (GLOVE) ×2 IMPLANT
GLOVE BIOGEL PI INDICATOR 7.0 (GLOVE) ×4
GLOVE EXAM NITRILE MD LF STRL (GLOVE) ×3 IMPLANT
GLOVE SURG SS PI 7.5 STRL IVOR (GLOVE) ×3 IMPLANT
GOWN STRL REUS W/ TWL XL LVL3 (GOWN DISPOSABLE) ×1 IMPLANT
GOWN STRL REUS W/TWL LRG LVL3 (GOWN DISPOSABLE) ×6 IMPLANT
GOWN STRL REUS W/TWL XL LVL3 (GOWN DISPOSABLE) ×2
HEMOSTAT SNOW SURGICEL 2X4 (HEMOSTASIS) ×3 IMPLANT
INST SET LAPROSCOPIC AP (KITS) ×3 IMPLANT
IV NS IRRIG 3000ML ARTHROMATIC (IV SOLUTION) IMPLANT
KIT ROOM TURNOVER APOR (KITS) ×3 IMPLANT
MANIFOLD NEPTUNE II (INSTRUMENTS) ×3 IMPLANT
NEEDLE INSUFFLATION 14GA 120MM (NEEDLE) ×3 IMPLANT
NS IRRIG 1000ML POUR BTL (IV SOLUTION) ×3 IMPLANT
PACK LAP CHOLE LZT030E (CUSTOM PROCEDURE TRAY) ×3 IMPLANT
PAD ARMBOARD 7.5X6 YLW CONV (MISCELLANEOUS) ×3 IMPLANT
POUCH SPECIMEN RETRIEVAL 10MM (ENDOMECHANICALS) ×3 IMPLANT
SET BASIN LINEN APH (SET/KITS/TRAYS/PACK) ×3 IMPLANT
SET TUBE IRRIG SUCTION NO TIP (IRRIGATION / IRRIGATOR) IMPLANT
SLEEVE ENDOPATH XCEL 5M (ENDOMECHANICALS) ×3 IMPLANT
SPONGE GAUZE 2X2 8PLY STER LF (GAUZE/BANDAGES/DRESSINGS) ×4
SPONGE GAUZE 2X2 8PLY STRL LF (GAUZE/BANDAGES/DRESSINGS) ×8 IMPLANT
STAPLER VISISTAT (STAPLE) ×3 IMPLANT
SUT VICRYL 0 UR6 27IN ABS (SUTURE) ×3 IMPLANT
TAPE CLOTH SURG 4X10 WHT LF (GAUZE/BANDAGES/DRESSINGS) ×3 IMPLANT
TROCAR ENDO BLADELESS 11MM (ENDOMECHANICALS) ×3 IMPLANT
TROCAR XCEL NON-BLD 5MMX100MML (ENDOMECHANICALS) ×3 IMPLANT
TROCAR XCEL UNIV SLVE 11M 100M (ENDOMECHANICALS) ×3 IMPLANT
TUBING INSUFFLATION (TUBING) ×3 IMPLANT
WARMER LAPAROSCOPE (MISCELLANEOUS) ×3 IMPLANT
YANKAUER SUCT 12FT TUBE ARGYLE (SUCTIONS) ×3 IMPLANT

## 2015-09-06 NOTE — Transfer of Care (Signed)
Immediate Anesthesia Transfer of Care Note  Patient: Claudia Wiggins  Procedure(s) Performed: Procedure(s): LAPAROSCOPIC CHOLECYSTECTOMY (N/A)  Patient Location: PACU  Anesthesia Type:General  Level of Consciousness: awake and confused  Airway & Oxygen Therapy: Patient Spontanous Breathing and Patient connected to face mask oxygen  Post-op Assessment: Report given to RN and Post -op Vital signs reviewed and stable  Post vital signs: Reviewed and stable  Last Vitals:  Filed Vitals:   09/06/15 0725 09/06/15 0730  BP: 146/66 140/59  Pulse:    Temp:    Resp: 12 14    Complications: No apparent anesthesia complications

## 2015-09-06 NOTE — OR Nursing (Signed)
Pt arrived to PACU restless, confused, unable to follow commands. Skin tear present on left hand. Gauze placed to site and wrapped with kerlix.

## 2015-09-06 NOTE — Anesthesia Preprocedure Evaluation (Signed)
Anesthesia Evaluation  Patient identified by MRN, date of birth, ID band Patient awake    Reviewed: Allergy & Precautions, NPO status , Patient's Chart, lab work & pertinent test results  Airway Mallampati: III  TM Distance: <3 FB Neck ROM: Full    Dental  (+) Teeth Intact, Dental Advisory Given   Pulmonary    breath sounds clear to auscultation       Cardiovascular + dysrhythmias  Rhythm:Regular Rate:Normal     Neuro/Psych PSYCHIATRIC DISORDERS (alzheimers )  Neuromuscular disease    GI/Hepatic GERD  Medicated and Controlled,  Endo/Other  Hypothyroidism   Renal/GU      Musculoskeletal   Abdominal   Peds  Hematology   Anesthesia Other Findings   Reproductive/Obstetrics                             Anesthesia Physical Anesthesia Plan  ASA: II  Anesthesia Plan: General   Post-op Pain Management:    Induction: Intravenous, Rapid sequence and Cricoid pressure planned  Airway Management Planned: Oral ETT  Additional Equipment:   Intra-op Plan:   Post-operative Plan: Extubation in OR  Informed Consent: I have reviewed the patients History and Physical, chart, labs and discussed the procedure including the risks, benefits and alternatives for the proposed anesthesia with the patient or authorized representative who has indicated his/her understanding and acceptance.     Plan Discussed with:   Anesthesia Plan Comments:         Anesthesia Quick Evaluation

## 2015-09-06 NOTE — Anesthesia Procedure Notes (Signed)
Procedure Name: Intubation Date/Time: 09/06/2015 7:42 AM Performed by: Andree Elk, AMY A Pre-anesthesia Checklist: Patient identified, Patient being monitored, Timeout performed, Emergency Drugs available and Suction available Patient Re-evaluated:Patient Re-evaluated prior to inductionOxygen Delivery Method: Circle System Utilized Preoxygenation: Pre-oxygenation with 100% oxygen Intubation Type: IV induction, Rapid sequence and Cricoid Pressure applied Laryngoscope Size: 3 and Glidescope Grade View: Grade I Tube type: Oral Tube size: 7.0 mm Number of attempts: 1 Airway Equipment and Method: Stylet and Video-laryngoscopy Placement Confirmation: ETT inserted through vocal cords under direct vision,  positive ETCO2 and breath sounds checked- equal and bilateral Secured at: 21 cm Tube secured with: Tape Dental Injury: Teeth and Oropharynx as per pre-operative assessment  Difficulty Due To: Difficulty was anticipated

## 2015-09-06 NOTE — Interval H&P Note (Signed)
History and Physical Interval Note:  09/06/2015 7:24 AM  Claudia Wiggins  has presented today for surgery, with the diagnosis of cholelithiasis  The various methods of treatment have been discussed with the patient and family. After consideration of risks, benefits and other options for treatment, the patient has consented to  Procedure(s): LAPAROSCOPIC CHOLECYSTECTOMY (N/A) as a surgical intervention .  The patient's history has been reviewed, patient examined, no change in status, stable for surgery.  I have reviewed the patient's chart and labs.  Questions were answered to the patient's satisfaction.     Aviva Signs A

## 2015-09-06 NOTE — Discharge Instructions (Signed)
Laparoscopic Cholecystectomy, Care After °Refer to this sheet in the next few weeks. These instructions provide you with information about caring for yourself after your procedure. Your health care provider may also give you more specific instructions. Your treatment has been planned according to current medical practices, but problems sometimes occur. Call your health care provider if you have any problems or questions after your procedure. °WHAT TO EXPECT AFTER THE PROCEDURE °After your procedure, it is common to have: °· Pain at your incision sites. You will be given pain medicines to control your pain. °· Mild nausea or vomiting. This should improve after the first 24 hours. °· Bloating and possible shoulder pain from the gas that was used during the procedure. This will improve after the first 24 hours. °HOME CARE INSTRUCTIONS °Incision Care °· Follow instructions from your health care provider about how to take care of your incisions. Make sure you: °¨ Wash your hands with soap and water before you change your bandage (dressing). If soap and water are not available, use hand sanitizer. °¨ Change your dressing as told by your health care provider. °¨ Leave stitches (sutures), skin glue, or adhesive strips in place. These skin closures may need to be in place for 2 weeks or longer. If adhesive strip edges start to loosen and curl up, you may trim the loose edges. Do not remove adhesive strips completely unless your health care provider tells you to do that. °· Do not take baths, swim, or use a hot tub until your health care provider approves. Ask your health care provider if you can take showers. You may only be allowed to take sponge baths for bathing. °General Instructions °· Take over-the-counter and prescription medicines only as told by your health care provider. °· Do not drive or operate heavy machinery while taking prescription pain medicine. °· Return to your normal diet as told by your health care  provider. °· Do not lift anything that is heavier than 10 lb (4.5 kg). °· Do not play contact sports for one week or until your health care provider approves. °SEEK MEDICAL CARE IF:  °· You have redness, swelling, or pain at the site of your incision. °· You have fluid, blood, or pus coming from your incision. °· You notice a bad smell coming from your incision area. °· Your surgical incisions break open. °· You have a fever. °SEEK IMMEDIATE MEDICAL CARE IF: °· You develop a rash. °· You have difficulty breathing. °· You have chest pain. °· You have increasing pain in your shoulders (shoulder strap areas). °· You faint or have dizzy episodes while you are standing. °· You have severe pain in your abdomen. °· You have nausea or vomiting that lasts for more than one day. °  °This information is not intended to replace advice given to you by your health care provider. Make sure you discuss any questions you have with your health care provider. °  °Document Released: 07/22/2005 Document Revised: 04/12/2015 Document Reviewed: 03/03/2013 °Elsevier Interactive Patient Education ©2016 Elsevier Inc. ° °

## 2015-09-06 NOTE — Anesthesia Postprocedure Evaluation (Signed)
Anesthesia Post Note  Patient: Claudia Wiggins  Procedure(s) Performed: Procedure(s) (LRB): LAPAROSCOPIC CHOLECYSTECTOMY (N/A)  Patient location during evaluation: PACU Anesthesia Type: General Level of consciousness: awake and alert and patient cooperative Pain management: pain level controlled Vital Signs Assessment: post-procedure vital signs reviewed and stable Respiratory status: spontaneous breathing and respiratory function stable Cardiovascular status: stable Anesthetic complications: no    Last Vitals:  Filed Vitals:   09/06/15 0928 09/06/15 0941  BP:    Pulse: 74 67  Temp:    Resp: 21 16    Last Pain:  Filed Vitals:   09/06/15 0942  PainSc: 3                  Marylee Belzer A

## 2015-09-06 NOTE — Op Note (Signed)
Patient:  Claudia Wiggins  DOB:  Aug 26, 1933  MRN:  [id number]   Preop Diagnosis:  Cholecystitis, cholelithiasis  Postop Diagnosis:  Same  Procedure:  Laparoscopic cholecystectomy  Surgeon:  Aviva Signs, M.D.  Anes:  Gen. endotracheal  Indications:  Patient is a 82 year old white female who presents with biliary colic secondary to cholelithiasis. The risks and benefits of the procedure including bleeding, infection, hepatobiliary injury, and the possibility of an open procedure were fully explained to the patient, who gave informed consent.  Procedure note:  The patient was placed the supine position. After induction of general endotracheal anesthesia, the abdomen was prepped and draped using the usual sterile technique with DuraPrep. Surgical site confirmation was performed.  A supraumbilical incision was made down to the fascia. A Veress needle was introduced into the abdominal cavity and confirmation of placement was done using the saline drop test. The abdomen was then insufflated to 16 mmHg pressure. An 11 mm trocar was introduced into the abdominal cavity under direct visualization without difficulty. The patient was placed in reverse Trendelenburg position and additional 11 mm trocar was placed the epigastric region and 5 mm trochars were placed the right upper quadrant and right flank regions. Liver was inspected and had some adhesions to the anterior abdominal wall and chest wall. These were freed away sharply without difficulty. The gallbladder was then retracted in a dynamic fashion in order to provide a critical view of the triangle of Calot. The cystic duct was first identified. Its junction to the infundibulum was fully identified. Endoclips were placed proximally and distally on the cystic duct, and the cystic duct was divided. This was likewise done the cystic artery. The gallbladder was freed away from the gallbladder fossa using Bovie electrocautery. The gallbladder was delivered  through the epigastric trocar site using an Endo Catch bag. The gallbladder fossa was inspected and no abnormal bleeding or bile leakage was noted. Surgicel was placed the gallbladder fossa. All fluid and air were then evacuated from the abdominal cavity prior to removal of the trochars.  All wounds were irrigated with normal saline. All wounds were injected with 0.5% Sensorcaine. The supraumbilical fascia as well as epigastric fascia were reapproximated using 0 Vicryl interrupted sutures. All skin incisions were closed using staples. Betadine ointment and dry sterile dressings were applied.  All tape and needle counts were correct at the end of the procedure. Patient was extubated in the operating room and transferred to PACU in stable condition.  Complications:  None  EBL:  Minimal  Specimen:  Gallbladder

## 2015-09-07 ENCOUNTER — Encounter (HOSPITAL_COMMUNITY): Payer: Self-pay | Admitting: General Surgery

## 2015-09-07 NOTE — Addendum Note (Signed)
Addendum  created 09/07/15 1033 by Vista Deck, CRNA   Modules edited: Charges VN

## 2015-09-20 DIAGNOSIS — H43812 Vitreous degeneration, left eye: Secondary | ICD-10-CM | POA: Diagnosis not present

## 2015-09-20 DIAGNOSIS — H34832 Tributary (branch) retinal vein occlusion, left eye, with macular edema: Secondary | ICD-10-CM | POA: Diagnosis not present

## 2015-10-23 DIAGNOSIS — F0390 Unspecified dementia without behavioral disturbance: Secondary | ICD-10-CM | POA: Diagnosis not present

## 2015-10-23 DIAGNOSIS — E785 Hyperlipidemia, unspecified: Secondary | ICD-10-CM | POA: Diagnosis not present

## 2015-10-23 DIAGNOSIS — E039 Hypothyroidism, unspecified: Secondary | ICD-10-CM | POA: Diagnosis not present

## 2015-11-08 DIAGNOSIS — H43812 Vitreous degeneration, left eye: Secondary | ICD-10-CM | POA: Diagnosis not present

## 2015-11-08 DIAGNOSIS — H34832 Tributary (branch) retinal vein occlusion, left eye, with macular edema: Secondary | ICD-10-CM | POA: Diagnosis not present

## 2015-12-20 DIAGNOSIS — H524 Presbyopia: Secondary | ICD-10-CM | POA: Diagnosis not present

## 2015-12-20 DIAGNOSIS — H5213 Myopia, bilateral: Secondary | ICD-10-CM | POA: Diagnosis not present

## 2015-12-20 DIAGNOSIS — H1820 Unspecified corneal edema: Secondary | ICD-10-CM | POA: Diagnosis not present

## 2015-12-20 DIAGNOSIS — H52223 Regular astigmatism, bilateral: Secondary | ICD-10-CM | POA: Diagnosis not present

## 2015-12-29 DIAGNOSIS — H34832 Tributary (branch) retinal vein occlusion, left eye, with macular edema: Secondary | ICD-10-CM | POA: Diagnosis not present

## 2015-12-29 DIAGNOSIS — H43812 Vitreous degeneration, left eye: Secondary | ICD-10-CM | POA: Diagnosis not present

## 2016-01-03 ENCOUNTER — Other Ambulatory Visit: Payer: Self-pay | Admitting: *Deleted

## 2016-01-03 DIAGNOSIS — H1820 Unspecified corneal edema: Secondary | ICD-10-CM | POA: Diagnosis not present

## 2016-01-03 DIAGNOSIS — H16141 Punctate keratitis, right eye: Secondary | ICD-10-CM | POA: Diagnosis not present

## 2016-01-03 NOTE — Patient Outreach (Signed)
[facility]) Care Management  01/03/2016  Claudia Wiggins 10/01/1933 [id number]   MD referral:  (referral was verbal referral for medication concern)  Telephone call to patient who was advised of reason for call & [facility] care management services. Patient states she is interested in speaking with RN CM but currently has to attend MD appointment. She is requesting call back at another time..  Plan: Will follow up. Patient agrees to set time for screening call.   Sherrin Daisy, RN BSN [place] Management Coordinator [facility] Care Management  [phone]

## 2016-01-04 ENCOUNTER — Other Ambulatory Visit: Payer: Self-pay | Admitting: *Deleted

## 2016-01-04 DIAGNOSIS — H3530 Unspecified macular degeneration: Secondary | ICD-10-CM

## 2016-01-04 NOTE — Patient Outreach (Signed)
[facility]) Care Management  01/04/2016  CYTLALY NORUM [birth year] [id number]   MD referral:  Telephone call to patient who gave HIPPA verification.   Patient states she lives alone and cares for self.  States she has macular degeneration and is unable to drive. States transportation is an issue when getting to doctor's appointments. States she has some doctors that are out of town. She also needs help with buying groceries and getting other things done that require transportation . States family members live out of town. States she has used a Designer, multimedia in the past and states cost is an issue.   Patient states she manages own medications and is taking as prescribed by her MD.  States no problem affording medications.  Patient states her health condition is very good.  States no problems managing her health. Walking without assistance of cane or walker.  Patient states she does receive Meals on Wheels once daily Monday through Friday but is able to cook other meals .  States major concern is getting transportation since she is unable to drive.    No RN CM needs assessed at this time,  Patient consents to [place] work referral for transportation needs.   Plan: Refer to care management assistant to refer to Clinical Social Worker for [employer]. Telephonic CM closing out of case.   Sherrin Daisy, RN BSN [place] Management Coordinator [facility] Care Management  [phone]

## 2016-01-05 ENCOUNTER — Encounter: Payer: Self-pay | Admitting: Licensed Clinical Social Worker

## 2016-01-05 ENCOUNTER — Other Ambulatory Visit: Payer: Self-pay | Admitting: Licensed Clinical Social Worker

## 2016-01-05 NOTE — Patient Outreach (Signed)
Assessment:  CSW received referral on Claudia Wiggins. CSW completed chart review on client on 01/05/16. Client sees Dr. Luan Pulling as primary care doctor. Client lives alone at her home and does have macular degeneration. Thus, she cannot drive and has reported that she sometimes needs help with transportation resources to help her go to and from her medical appointments. She has reported that family members reside out of town.  She has [employer]. CSW called client on 01/05/16 and spoke via phone with client on 01/05/16. CSW verified client identity. CSW received verbal permission from client on 01/05/16 for CSW to speak with client about needs of client.  CSW talked with client about [facility] consent form completion.. CSW informed client that Freda Jackson, case management assistant, planned to mail [facility] consent form and return envelope to client for review.. CSW and client completed needed [facility] assessments for client.  CSW shared information with client about two transport agencies in [place], [place] area;  [employer] and [employer], research (life sciences). Client said she will probably call [facility] and add her name to transport assist list.  CSW talked with [place] about normal costs for transport in [place] with [facility]. Cient said she may be going on vacation soon. She said she may start a vacation on 01/12/16 with her son and other family members.   She said she may be on vacation for a short while. She said she may try to move to [place] eventually to stay with her son.  Plans are still in process for client. She does see Dr. Luan Pulling locally as primary care doctor. Client and CSW spoke of care plan for client. CSW encouraged client to communicate as needed with CSW in next 30 days to discuss transport resources for client in the area.  She said she also has a Curator who lives in [place], [place] who assists her occasionally.  CSW thanked [place] for phone conversation with CSW.  CSW invited Maddalena to call CSW at [phone] as needed to discuss social work needs of client.   Plan:  Client to communicate with CSW in next 30 days to discuss transportation resources for client in the area. CSW to call client in two weeks to assess needs of client at that time.  [name].Norwood Quezada MSW, LCSW Licensed Clinical Social Worker [facility] Care Management [phone]

## 2016-01-11 ENCOUNTER — Other Ambulatory Visit: Payer: Self-pay | Admitting: Licensed Clinical Social Worker

## 2016-01-11 NOTE — Patient Outreach (Signed)
Assessment:  CSW spoke via phone with client on 01/11/16.  CSW verified client identity. CSW received verbal permission from client on 01/11/16 for CSW to speak with client about current client needs. Client said she sees Dr. Luan Pulling as primary care doctor. Client has macular degeneration. Thus, she cannot drive and sometimes needs transport assistance to and from her scheduled medical appointments. During previous call with client, CSW had provided client with names and phone numbers of [employer] and [employer] (both transport agencies in [place], [place]). CSW encouraged client to communicate with CSW as needed in next 30 days to discuss transportation resources for client in the community. Client said that her son had called [employer] and had added client name to transport assistance list for that agency. CSW reminded Claudia Wiggins that she would need to call that agency at least 3 business days before her appointment to schedule transport assisstance for herself through that agency.  Client said she requested that her pharmacy provide her with medication list for client..  She said she had her prescribed medications and was taking medications as prescribed. She did not speak of any pain issues. Client said she receives Meals on Wheels support weekly as scheduled.    She said her neighbors help her with food procurement.  Client has pet dogs she enjoys.  She said her pet dogs help her relax.  CSW thanked client for phone conversation with CSW on 01/11/16. CSW encouraged client to call CSW as needed at [phone] to discuss social work needs of client.   Plan:  Client to communicate with CSW in next 30 days to discuss transportation resources for client in the area. CSW to call client in 3 weeks to assess needs of client at that time.  [name].Frank Novelo MSW, LCSW Licensed Clinical Social Worker [facility] Care Management [phone]

## 2016-02-01 ENCOUNTER — Other Ambulatory Visit: Payer: Self-pay | Admitting: Licensed Clinical Social Worker

## 2016-02-01 NOTE — Patient Outreach (Signed)
Assessment:  CSW spoke via phone with client on 02/01/16. CSW verified client identity. CSW received verbal permission from client on 02/01/16 for CSW to speak with client about current needs of client. CSW and client spoke of client needs. Client sees Dr. Luan Pulling as her primary care doctor. Client has macular degeneration. Thus she needs assistance with transport support to take her to and from her scheduled medical appointments. CSW had previously provided client with names and phone numbers of Conservation officer, nature and of Aging, Disability [facility] both in [place], [place]. [place] had encouraged client to call these agencies as needed for transport support for client to go to and from her scheduled medical appointments. Client said that her son had called [facility]ging, [place] [facility] recently and son had added client to transport assist list with [facility]ability [facility]. CSW and client spoke of client care plan. CSW encouraged client to communicate with CSW as needed in the next 30 days to discuss transport resources for client in the area. Client said she had her prescribed medications and was taking medications as prescribed. She did not mention any pain issues.  Client receives some food support with local Meals on Wheels program. Also, her neighbors sometimes assist her with some food procurement. She has pet dogs she enjoys. She said that her pet dogs help her to relax.  Client has neighbor nearby and client enjoys visiting with neighbor. Client also has Medicaid. CSW encouraged client to call [facility] to ensure her name is on  agency transport list. CSW again gave client the phone number for [facility]. Client said she plans to continue residing at her home at present.  CSW encouraged client to call CSW as needed at [phone] to discuss social work needs of client.   Plan:  Client to communicate  with CSW in next 30 days to discuss transport resources of assistance for client in the area. CSW to call client in 4 weeks to assess needs of client at that time.   [name].Starlina Lapre MSW, LCSW Licensed Clinical Social Worker [facility] Care Management [phone]

## 2016-02-14 DIAGNOSIS — F329 Major depressive disorder, single episode, unspecified: Secondary | ICD-10-CM | POA: Diagnosis not present

## 2016-02-14 DIAGNOSIS — R410 Disorientation, unspecified: Secondary | ICD-10-CM | POA: Diagnosis not present

## 2016-02-14 DIAGNOSIS — F0390 Unspecified dementia without behavioral disturbance: Secondary | ICD-10-CM | POA: Diagnosis not present

## 2016-02-14 DIAGNOSIS — E039 Hypothyroidism, unspecified: Secondary | ICD-10-CM | POA: Diagnosis not present

## 2016-03-01 ENCOUNTER — Other Ambulatory Visit: Payer: Self-pay | Admitting: Licensed Clinical Social Worker

## 2016-03-01 NOTE — Patient Outreach (Signed)
Assessment:  CSW spoke via phone with client on 03/01/16. CSW verified client identity. CSW received verbal permission from client on 03/01/16 for CSW to talk with client about current client needs. Client sees Dr. Luan Pulling as primary care doctor. Client has macular degeneration; thus, she needs transport assistance to take her to and from her scheduled medical appointments.  CSW had previously talked with client about [facility] as resource for transport assistance for client. Client reported that her son had called that transport agency and had added client to transport assist list for [facility]ability [facility]. CSW and client spoke of client care plan. CSW encouraged client to communicate with CSW as needed in next 30 days to discuss transport resources for client in the area. Client said she had her prescribed medications and is taking medications as prescribed. She said she does receive some food support from [employer] on Wheels program.  Client said she plans to continue residing at her home at present. She said she has pet dogs and that these pet dogs help her to relax.She said she has support from her son and he visits her regularly. CSW encouraged client to call CSW at [phone] as needed to discuss social work needs of client.   Plan:  Client to communicate with CSW as needed in next 30 days to discuss transport resources for client in the area. CSW to call client in 4 weeks to assess needs of client at that time.   [name].Gaspar Fowle MSW, LCSW Licensed Clinical Social Worker [facility] Care Management [phone]

## 2016-03-06 DIAGNOSIS — G301 Alzheimer's disease with late onset: Secondary | ICD-10-CM | POA: Diagnosis not present

## 2016-03-06 DIAGNOSIS — M130 Polyarthritis, unspecified: Secondary | ICD-10-CM | POA: Diagnosis not present

## 2016-03-06 DIAGNOSIS — E7800 Pure hypercholesterolemia, unspecified: Secondary | ICD-10-CM | POA: Diagnosis not present

## 2016-03-06 DIAGNOSIS — M818 Other osteoporosis without current pathological fracture: Secondary | ICD-10-CM | POA: Diagnosis not present

## 2016-03-06 DIAGNOSIS — Z79899 Other long term (current) drug therapy: Secondary | ICD-10-CM | POA: Diagnosis not present

## 2016-03-06 DIAGNOSIS — E039 Hypothyroidism, unspecified: Secondary | ICD-10-CM | POA: Diagnosis not present

## 2016-03-06 DIAGNOSIS — E559 Vitamin D deficiency, unspecified: Secondary | ICD-10-CM | POA: Diagnosis not present

## 2016-03-06 DIAGNOSIS — E539 Vitamin B deficiency, unspecified: Secondary | ICD-10-CM | POA: Diagnosis not present

## 2016-03-08 DIAGNOSIS — G301 Alzheimer's disease with late onset: Secondary | ICD-10-CM | POA: Diagnosis not present

## 2016-03-08 DIAGNOSIS — R41841 Cognitive communication deficit: Secondary | ICD-10-CM | POA: Diagnosis not present

## 2016-03-08 DIAGNOSIS — M130 Polyarthritis, unspecified: Secondary | ICD-10-CM | POA: Diagnosis not present

## 2016-03-08 DIAGNOSIS — F0281 Dementia in other diseases classified elsewhere with behavioral disturbance: Secondary | ICD-10-CM | POA: Diagnosis not present

## 2016-03-08 DIAGNOSIS — H3530 Unspecified macular degeneration: Secondary | ICD-10-CM | POA: Diagnosis not present

## 2016-03-08 DIAGNOSIS — E7800 Pure hypercholesterolemia, unspecified: Secondary | ICD-10-CM | POA: Diagnosis not present

## 2016-03-08 DIAGNOSIS — E039 Hypothyroidism, unspecified: Secondary | ICD-10-CM | POA: Diagnosis not present

## 2016-03-13 DIAGNOSIS — R41841 Cognitive communication deficit: Secondary | ICD-10-CM | POA: Diagnosis not present

## 2016-03-13 DIAGNOSIS — F0281 Dementia in other diseases classified elsewhere with behavioral disturbance: Secondary | ICD-10-CM | POA: Diagnosis not present

## 2016-03-13 DIAGNOSIS — H3530 Unspecified macular degeneration: Secondary | ICD-10-CM | POA: Diagnosis not present

## 2016-03-13 DIAGNOSIS — M130 Polyarthritis, unspecified: Secondary | ICD-10-CM | POA: Diagnosis not present

## 2016-03-13 DIAGNOSIS — G301 Alzheimer's disease with late onset: Secondary | ICD-10-CM | POA: Diagnosis not present

## 2016-03-15 DIAGNOSIS — M130 Polyarthritis, unspecified: Secondary | ICD-10-CM | POA: Diagnosis not present

## 2016-03-15 DIAGNOSIS — F0281 Dementia in other diseases classified elsewhere with behavioral disturbance: Secondary | ICD-10-CM | POA: Diagnosis not present

## 2016-03-15 DIAGNOSIS — H3530 Unspecified macular degeneration: Secondary | ICD-10-CM | POA: Diagnosis not present

## 2016-03-15 DIAGNOSIS — G301 Alzheimer's disease with late onset: Secondary | ICD-10-CM | POA: Diagnosis not present

## 2016-03-15 DIAGNOSIS — R41841 Cognitive communication deficit: Secondary | ICD-10-CM | POA: Diagnosis not present

## 2016-03-18 DIAGNOSIS — R41841 Cognitive communication deficit: Secondary | ICD-10-CM | POA: Diagnosis not present

## 2016-03-18 DIAGNOSIS — M130 Polyarthritis, unspecified: Secondary | ICD-10-CM | POA: Diagnosis not present

## 2016-03-18 DIAGNOSIS — H3530 Unspecified macular degeneration: Secondary | ICD-10-CM | POA: Diagnosis not present

## 2016-03-18 DIAGNOSIS — G301 Alzheimer's disease with late onset: Secondary | ICD-10-CM | POA: Diagnosis not present

## 2016-03-18 DIAGNOSIS — F0281 Dementia in other diseases classified elsewhere with behavioral disturbance: Secondary | ICD-10-CM | POA: Diagnosis not present

## 2016-03-19 DIAGNOSIS — F0281 Dementia in other diseases classified elsewhere with behavioral disturbance: Secondary | ICD-10-CM | POA: Diagnosis not present

## 2016-03-19 DIAGNOSIS — R41841 Cognitive communication deficit: Secondary | ICD-10-CM | POA: Diagnosis not present

## 2016-03-19 DIAGNOSIS — G301 Alzheimer's disease with late onset: Secondary | ICD-10-CM | POA: Diagnosis not present

## 2016-03-19 DIAGNOSIS — M130 Polyarthritis, unspecified: Secondary | ICD-10-CM | POA: Diagnosis not present

## 2016-03-19 DIAGNOSIS — H3530 Unspecified macular degeneration: Secondary | ICD-10-CM | POA: Diagnosis not present

## 2016-03-20 DIAGNOSIS — R41841 Cognitive communication deficit: Secondary | ICD-10-CM | POA: Diagnosis not present

## 2016-03-20 DIAGNOSIS — M130 Polyarthritis, unspecified: Secondary | ICD-10-CM | POA: Diagnosis not present

## 2016-03-20 DIAGNOSIS — F0281 Dementia in other diseases classified elsewhere with behavioral disturbance: Secondary | ICD-10-CM | POA: Diagnosis not present

## 2016-03-20 DIAGNOSIS — H3530 Unspecified macular degeneration: Secondary | ICD-10-CM | POA: Diagnosis not present

## 2016-03-20 DIAGNOSIS — G301 Alzheimer's disease with late onset: Secondary | ICD-10-CM | POA: Diagnosis not present

## 2016-03-25 DIAGNOSIS — G301 Alzheimer's disease with late onset: Secondary | ICD-10-CM | POA: Diagnosis not present

## 2016-03-25 DIAGNOSIS — R41841 Cognitive communication deficit: Secondary | ICD-10-CM | POA: Diagnosis not present

## 2016-03-25 DIAGNOSIS — M130 Polyarthritis, unspecified: Secondary | ICD-10-CM | POA: Diagnosis not present

## 2016-03-25 DIAGNOSIS — F0281 Dementia in other diseases classified elsewhere with behavioral disturbance: Secondary | ICD-10-CM | POA: Diagnosis not present

## 2016-03-25 DIAGNOSIS — H3530 Unspecified macular degeneration: Secondary | ICD-10-CM | POA: Diagnosis not present

## 2016-03-27 DIAGNOSIS — R41841 Cognitive communication deficit: Secondary | ICD-10-CM | POA: Diagnosis not present

## 2016-03-27 DIAGNOSIS — F0281 Dementia in other diseases classified elsewhere with behavioral disturbance: Secondary | ICD-10-CM | POA: Diagnosis not present

## 2016-03-27 DIAGNOSIS — G301 Alzheimer's disease with late onset: Secondary | ICD-10-CM | POA: Diagnosis not present

## 2016-03-27 DIAGNOSIS — H3530 Unspecified macular degeneration: Secondary | ICD-10-CM | POA: Diagnosis not present

## 2016-03-27 DIAGNOSIS — M130 Polyarthritis, unspecified: Secondary | ICD-10-CM | POA: Diagnosis not present

## 2016-03-28 DIAGNOSIS — G301 Alzheimer's disease with late onset: Secondary | ICD-10-CM | POA: Diagnosis not present

## 2016-03-28 DIAGNOSIS — H3530 Unspecified macular degeneration: Secondary | ICD-10-CM | POA: Diagnosis not present

## 2016-03-28 DIAGNOSIS — M130 Polyarthritis, unspecified: Secondary | ICD-10-CM | POA: Diagnosis not present

## 2016-03-28 DIAGNOSIS — R41841 Cognitive communication deficit: Secondary | ICD-10-CM | POA: Diagnosis not present

## 2016-03-28 DIAGNOSIS — F0281 Dementia in other diseases classified elsewhere with behavioral disturbance: Secondary | ICD-10-CM | POA: Diagnosis not present

## 2016-04-01 DIAGNOSIS — M130 Polyarthritis, unspecified: Secondary | ICD-10-CM | POA: Diagnosis not present

## 2016-04-01 DIAGNOSIS — G301 Alzheimer's disease with late onset: Secondary | ICD-10-CM | POA: Diagnosis not present

## 2016-04-01 DIAGNOSIS — R41841 Cognitive communication deficit: Secondary | ICD-10-CM | POA: Diagnosis not present

## 2016-04-01 DIAGNOSIS — H3530 Unspecified macular degeneration: Secondary | ICD-10-CM | POA: Diagnosis not present

## 2016-04-01 DIAGNOSIS — F0281 Dementia in other diseases classified elsewhere with behavioral disturbance: Secondary | ICD-10-CM | POA: Diagnosis not present

## 2016-04-03 ENCOUNTER — Other Ambulatory Visit: Payer: Self-pay | Admitting: Licensed Clinical Social Worker

## 2016-04-03 DIAGNOSIS — G301 Alzheimer's disease with late onset: Secondary | ICD-10-CM | POA: Diagnosis not present

## 2016-04-03 DIAGNOSIS — M130 Polyarthritis, unspecified: Secondary | ICD-10-CM | POA: Diagnosis not present

## 2016-04-03 DIAGNOSIS — R41841 Cognitive communication deficit: Secondary | ICD-10-CM | POA: Diagnosis not present

## 2016-04-03 DIAGNOSIS — H3530 Unspecified macular degeneration: Secondary | ICD-10-CM | POA: Diagnosis not present

## 2016-04-03 DIAGNOSIS — F0281 Dementia in other diseases classified elsewhere with behavioral disturbance: Secondary | ICD-10-CM | POA: Diagnosis not present

## 2016-04-03 NOTE — Patient Outreach (Signed)
Assessment:  CSW spoke via phone with client on 04/03/16. CSW verified client identity. CSW received verbal permission from client on 04/03/16 for CSW to speak with client about current client needs. Client said that her family members live out of town. She said she has macular degeneration and does need transportation assistance to go to and from her scheduled medical appointments. Client receives medical care from Dr. Luan Pulling as her primary care physician. Client has been receiving Meals on Wheels support weekly as scheduled. She also said that her neighbor helps her with food procurement for client as needed.  CSW and client spoke of client care plan. CSW encouraged client to communicate with CSW  in next 30 days to discuss transportation resources of assistance for client.  Client said she has her prescribed medications and is taking medications as prescribed. Client said she now has a lifeline she wears for assistance.  Client also said that just recently she had cancelled Meals on Wheels support.  She said she does not use a cane or walker to ambulate. She has pet dogs she cares for.  She said that her pet dogs help her relax.  Client said that RN from [facility] is conducting home visits as scheduled with client.  She said RN from [facility] has been visitit client in the home for about one month.  Client said she has Medicare and Medicaid.  Client said she does fatigue easily and has to take rest breaks.  Client said she plans to visit her son in [place] later this week.  She said she plans to stay with her son, Fulton Mole, for two weeks at his home in [place]. CSW spoke with client about her daughter [name] and contact phone number for her daughter, [name]. CSW thanked client for phone call with CSW on 04/03/16. Nelie was appreciative of phone call from [place] on 04/03/16.    Plan:  Client to communicate with CSW in next 30 days to discuss transportation resources of assistance for  client.  CSW to call client in 4 weeks to assess client needs.  [name].Kathlene Yano MSW, LCSW Licensed Clinical Social Worker [facility] Care Management [phone]

## 2016-04-05 DIAGNOSIS — F0281 Dementia in other diseases classified elsewhere with behavioral disturbance: Secondary | ICD-10-CM | POA: Diagnosis not present

## 2016-04-05 DIAGNOSIS — R41841 Cognitive communication deficit: Secondary | ICD-10-CM | POA: Diagnosis not present

## 2016-04-05 DIAGNOSIS — M130 Polyarthritis, unspecified: Secondary | ICD-10-CM | POA: Diagnosis not present

## 2016-04-05 DIAGNOSIS — H3530 Unspecified macular degeneration: Secondary | ICD-10-CM | POA: Diagnosis not present

## 2016-04-05 DIAGNOSIS — G301 Alzheimer's disease with late onset: Secondary | ICD-10-CM | POA: Diagnosis not present

## 2016-04-11 DIAGNOSIS — M130 Polyarthritis, unspecified: Secondary | ICD-10-CM | POA: Diagnosis not present

## 2016-04-11 DIAGNOSIS — F0281 Dementia in other diseases classified elsewhere with behavioral disturbance: Secondary | ICD-10-CM | POA: Diagnosis not present

## 2016-04-11 DIAGNOSIS — R41841 Cognitive communication deficit: Secondary | ICD-10-CM | POA: Diagnosis not present

## 2016-04-11 DIAGNOSIS — G301 Alzheimer's disease with late onset: Secondary | ICD-10-CM | POA: Diagnosis not present

## 2016-04-11 DIAGNOSIS — H3530 Unspecified macular degeneration: Secondary | ICD-10-CM | POA: Diagnosis not present

## 2016-05-06 ENCOUNTER — Other Ambulatory Visit: Payer: Self-pay | Admitting: Licensed Clinical Social Worker

## 2016-05-06 NOTE — Patient Outreach (Signed)
Assessment:  CSW spoke via phone with client. CSW verified client identity. CSW received verbal permission from client on 05/06/16 for CSW to speak with client about current client needs. CSW and client spoke of client needs.  Client sees Dr. Luan Pulling as primary care doctor.  Client has macular degeneration. She said she needs transport assistance to and from her scheduled medical appointments.   CSW and client spoke of client care plan.  CSW encouraged client to communicate with CSW in next 30 days to discuss community resources for transport assistance for client.  Client did not mention any pain issues. She said she had her prescribed medications and was taking medications as prescribed.  Client had received some food support with local [employer] Whe[employer] program. She said that she had cancelled her participation in [employer] program.. CSW had previously given client information about Aging, Disability and Transient Services. CSW again encouraged client to call that agency related to transport assistance as needed.  Client said she wears a LifeLine for assistance as needed. She said she does not use a cane or walker to ambulate. Client has Medicare and Medicaid. She said she fatigues easily and takes rest breaks as needed. She enjoys caring for her pet dogs as a means of relaxation.  CSW encouraged client to call CSW at [phone] as needed to discuss social work needs of client.     Plan:  Client to communicate with CSW in next 30 days to discuss community resources for transport assistance for client.  CSW to call client in 4 weeks to assess client needs.  [name].Sunjai Levandoski MSW, LCSW Licensed Clinical Social Worker [facility] Care Management [phone]

## 2016-05-17 DIAGNOSIS — E039 Hypothyroidism, unspecified: Secondary | ICD-10-CM | POA: Diagnosis not present

## 2016-05-17 DIAGNOSIS — F0280 Dementia in other diseases classified elsewhere without behavioral disturbance: Secondary | ICD-10-CM | POA: Diagnosis not present

## 2016-05-17 DIAGNOSIS — K219 Gastro-esophageal reflux disease without esophagitis: Secondary | ICD-10-CM | POA: Diagnosis not present

## 2016-05-17 DIAGNOSIS — G301 Alzheimer's disease with late onset: Secondary | ICD-10-CM | POA: Diagnosis not present

## 2016-05-17 DIAGNOSIS — N3281 Overactive bladder: Secondary | ICD-10-CM | POA: Diagnosis not present

## 2016-06-07 ENCOUNTER — Other Ambulatory Visit: Payer: Self-pay | Admitting: Licensed Clinical Social Worker

## 2016-06-07 ENCOUNTER — Encounter: Payer: Self-pay | Admitting: Licensed Clinical Social Worker

## 2016-06-07 NOTE — Patient Outreach (Signed)
Assessment;  CSW spoke via phone with Claudia Wiggins, daughter of client. CSW verified identity of Claudia Wiggins.  CSW received verbal permission from [name] on 06/07/16 for CSW to speak with her about  current client needs.  Client receives medical support from Dr. Luan Pulling as her primary care doctor. Client has macular degeneration and said she needs help with transportation to and from her scheduled medical appointments. CSW and [name]  spoke of client care plan. CSW had encouraged that client communicate with CSW in next 30 days to discuss community resources for transportation assistance for client.   Client has had a Life Line that she wears for assistance. Client has had her prescribed medications and was taking her medications as prescribed.  Client has Medicare and Medicaid.  She has pet dogs she cares for and said that her pet dogs help her with relaxation.  Claudia Wiggins , daughter of client, informed CSW on 06/07/16 that client had now moved to [place] and was no longer in need of [facility] support services. [name] said that client planned to live with family members in [place] and planned to get Primary Care doctor in [place]. [name] said client had already been residing in [place] for several weeks. CSW informed [name] that since client had moved to [place] and [name] said client had no further CSW needs at present, that [facility] would discharge client from [place] services on 06/07/16. [name] agreed to this plan and said she would inform client of this information on 06/07/16.  [name] said that client and she [name]) were appreciative of support of [facility] program for client in recent months.    Plan:  CSW is discharging [name] on 06/07/16 from [place] services since client has no further CSW needs and client has now moved to [place].  CSW to inform [name] that [facility] discharged client on 06/07/16 from [place] services.  CSW to fax physician case closure letter to Dr. Luan Pulling informing Dr. Luan Pulling that [facility]  discharged client on 06/07/16 from [place] services.    [name].Bruchy Mikel MSW, LCSW Licensed Clinical Social Worker [facility] Care Management [phone]

## 2016-07-05 DIAGNOSIS — G301 Alzheimer's disease with late onset: Secondary | ICD-10-CM | POA: Diagnosis not present

## 2016-07-05 DIAGNOSIS — N3281 Overactive bladder: Secondary | ICD-10-CM | POA: Diagnosis not present

## 2016-07-05 DIAGNOSIS — Z0000 Encounter for general adult medical examination without abnormal findings: Secondary | ICD-10-CM | POA: Diagnosis not present

## 2016-07-05 DIAGNOSIS — K219 Gastro-esophageal reflux disease without esophagitis: Secondary | ICD-10-CM | POA: Diagnosis not present

## 2016-07-05 DIAGNOSIS — E039 Hypothyroidism, unspecified: Secondary | ICD-10-CM | POA: Diagnosis not present

## 2016-07-05 DIAGNOSIS — F0280 Dementia in other diseases classified elsewhere without behavioral disturbance: Secondary | ICD-10-CM | POA: Diagnosis not present

## 2016-09-03 DIAGNOSIS — Z961 Presence of intraocular lens: Secondary | ICD-10-CM | POA: Diagnosis not present

## 2016-09-03 DIAGNOSIS — H04121 Dry eye syndrome of right lacrimal gland: Secondary | ICD-10-CM | POA: Diagnosis not present

## 2016-09-03 DIAGNOSIS — B0052 Herpesviral keratitis: Secondary | ICD-10-CM | POA: Diagnosis not present

## 2016-09-03 DIAGNOSIS — H04122 Dry eye syndrome of left lacrimal gland: Secondary | ICD-10-CM | POA: Diagnosis not present

## 2016-10-03 IMAGING — CT CT HEAD W/O CM
1 series · 16 of 30 positions shown, 20 images · non-contrast
Comparison: None.

CLINICAL DATA: Vertigo.  Dysarthria and new onset confusion

EXAM:
CT HEAD WITHOUT CONTRAST
TECHNIQUE: Contiguous axial images were obtained from the base of the skull
through the vertex without intravenous contrast.

[Series 2: headseq 4.8 h37s · axial · 0.43mm/px · z∈[+115,+250]mm · 16 of 30 slices shown, 20 images]
[im 2/30  brain]
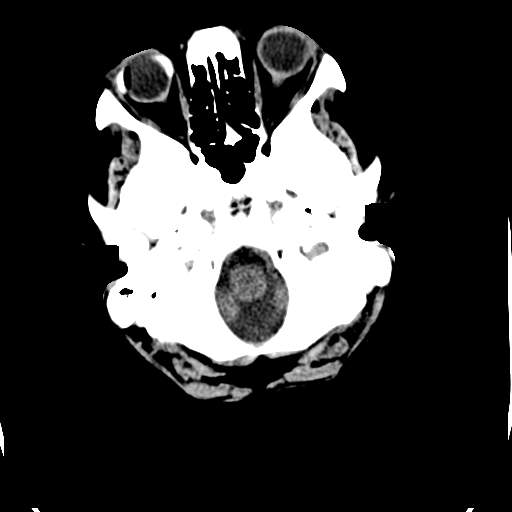
[im 2/30  bone]
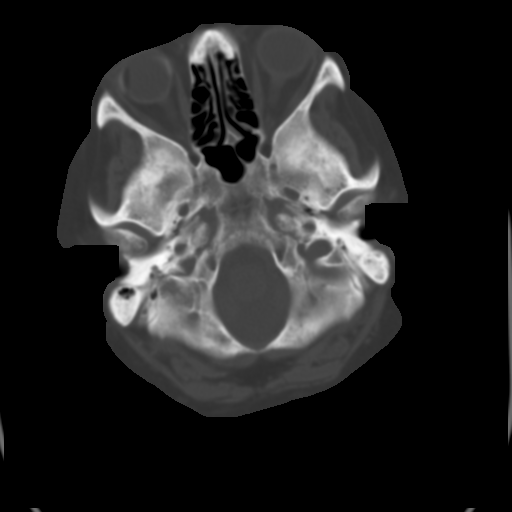
[im 4/30  brain]
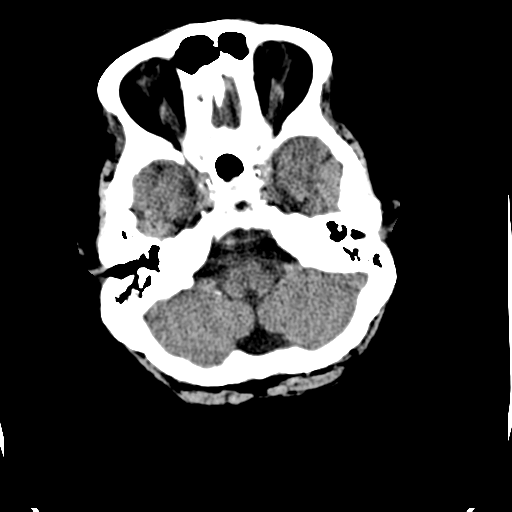
[im 6/30  brain]
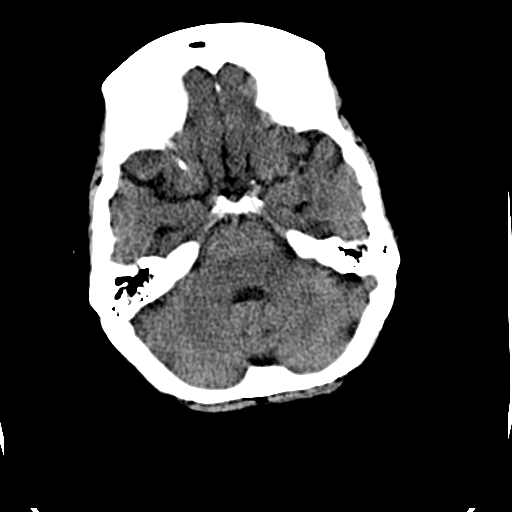
[im 8/30  brain]
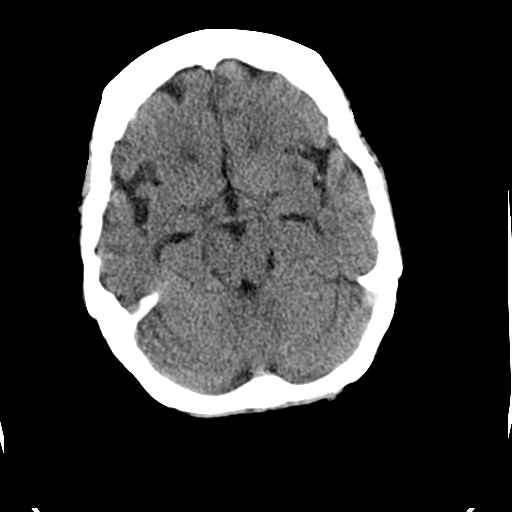
[im 9/30  brain]
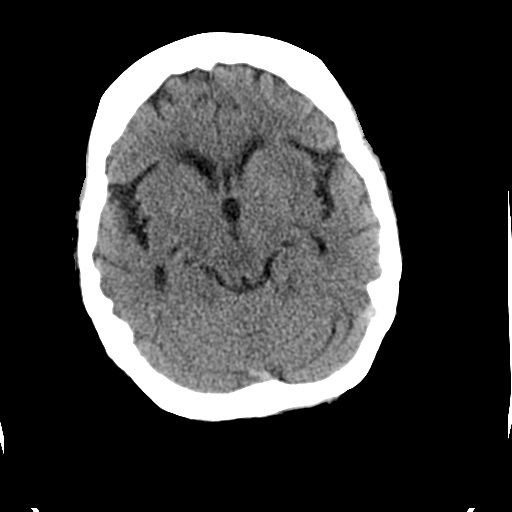
[im 9/30  bone]
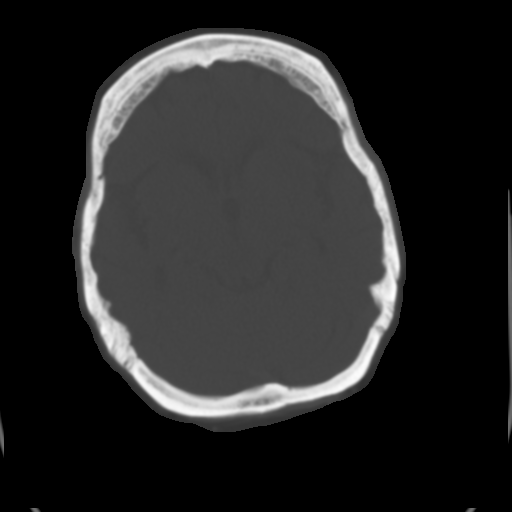
[im 11/30  brain]
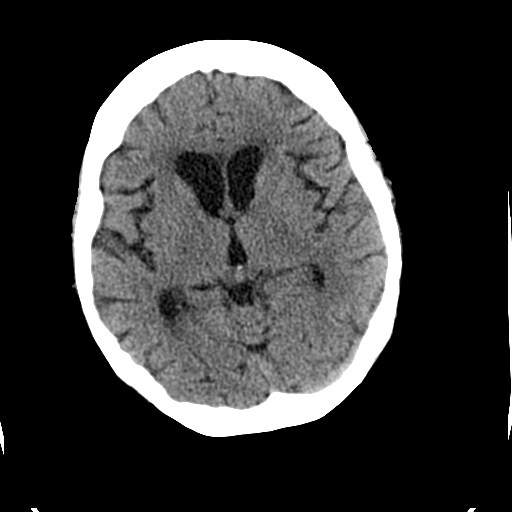
[im 13/30  brain]
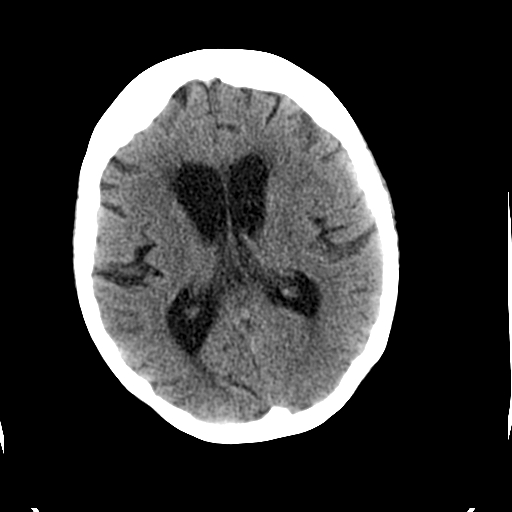
[im 15/30  brain]
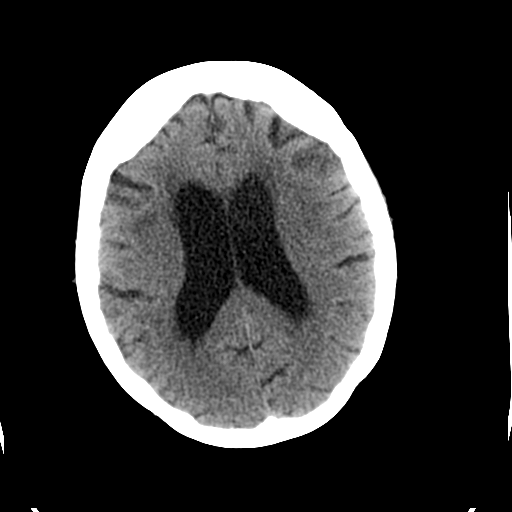
[im 16/30  brain]
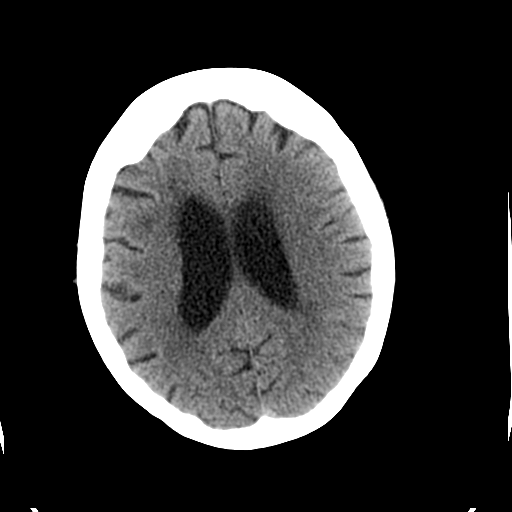
[im 16/30  bone]
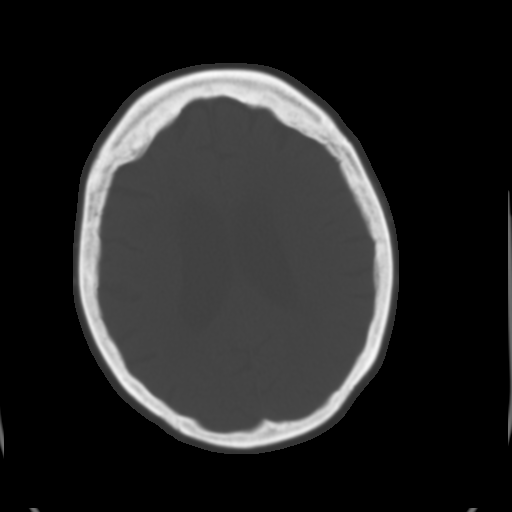
[im 18/30  brain]
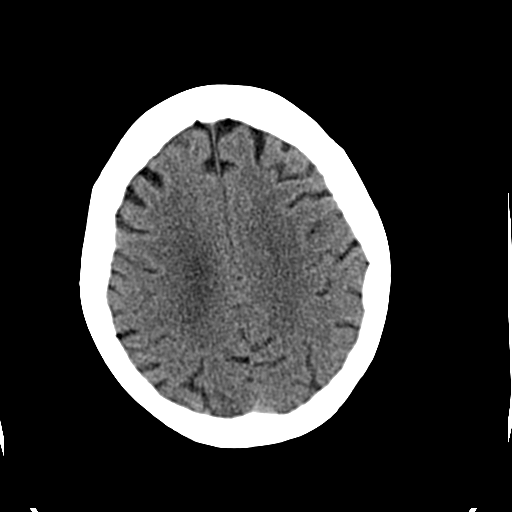
[im 20/30  brain]
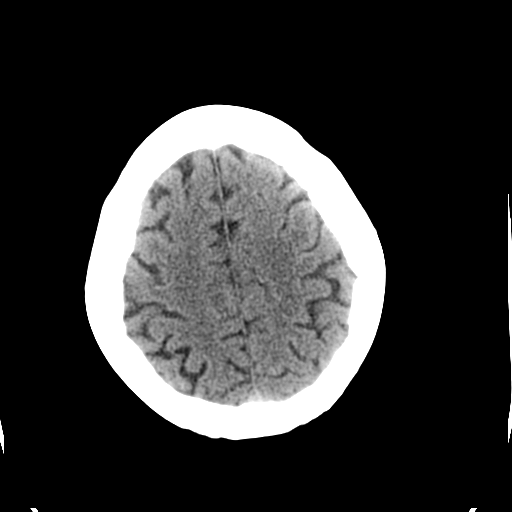
[im 22/30  brain]
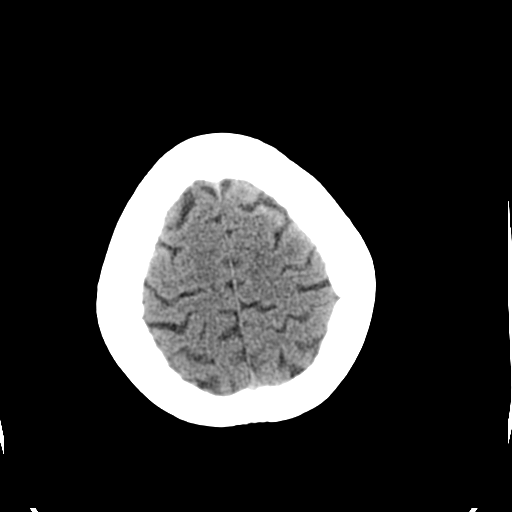
[im 23/30  brain]
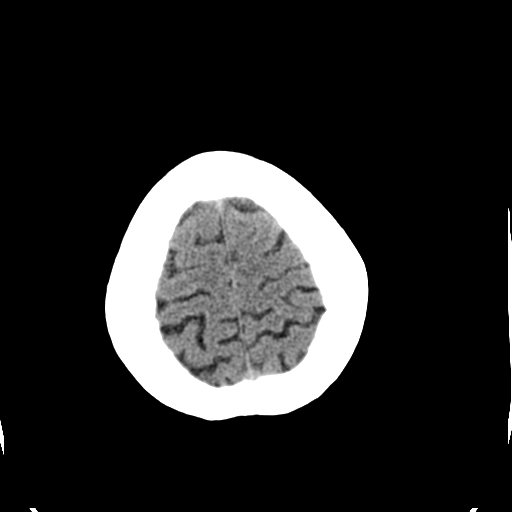
[im 23/30  bone]
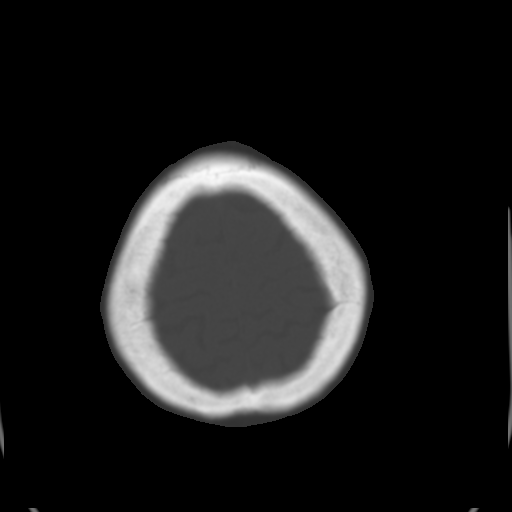
[im 25/30  brain]
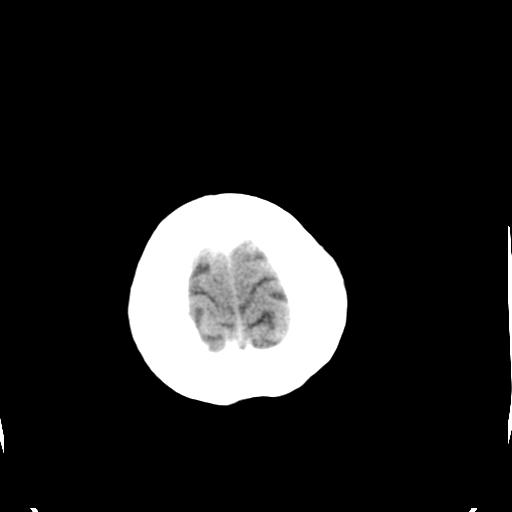
[im 27/30  brain]
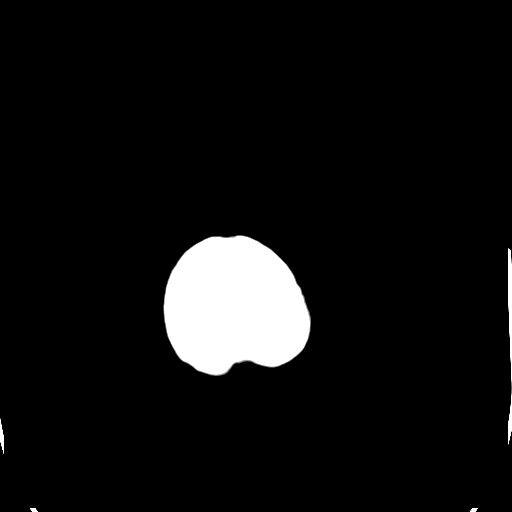
[im 29/30  brain]
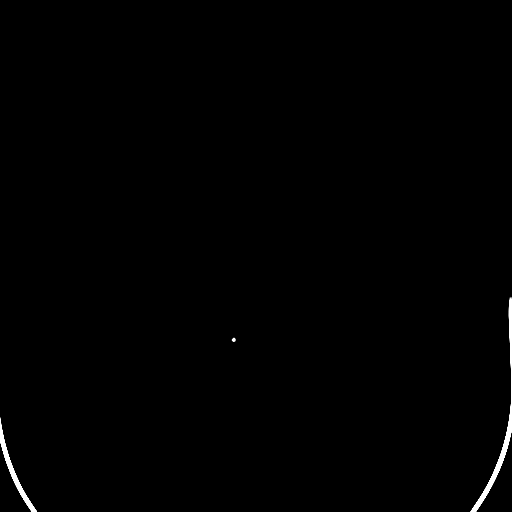

[16 of 30 positions shown; findings below may reference images not displayed]

FINDINGS: The ventricles are mildly prominent. The sulci appear normal.
Prominence of the cisterna magna is an anatomic variant. There is no
intracranial mass, hemorrhage, extra-axial fluid collection, or
midline shift. There is mild small vessel disease in the centra
semiovale bilaterally. Gray-white compartments elsewhere normal. No
acute infarct evident. Bony calvarium appears intact. Mastoid air
cells are clear. The patient has undergone prior scleral banding
procedure on the right.
IMPRESSION: Ventricles are mildly prominent compared to the sulci. Question
early normal pressure hydrocephalus. Mild periventricular small
vessel disease is noted. There is no intracranial mass, hemorrhage,
or acute appearing infarct.

## 2016-10-03 IMAGING — US US PELVIS COMPLETE
1 series · 14 of 20 positions shown · non-contrast
Comparison: None.

CLINICAL DATA: Diffuse abdominal pain.  Previous hysterectomy.

EXAM:
TRANSABDOMINAL ULTRASOUND OF PELVIS
TECHNIQUE: Transabdominal ultrasound examination of the pelvis was performed
including evaluation of the uterus, ovaries, adnexal regions, and
pelvic cul-de-sac.

[Series 1: us pelvis complete · 0.25mm/px · 14 of 20 slices shown]
[im 1/20]
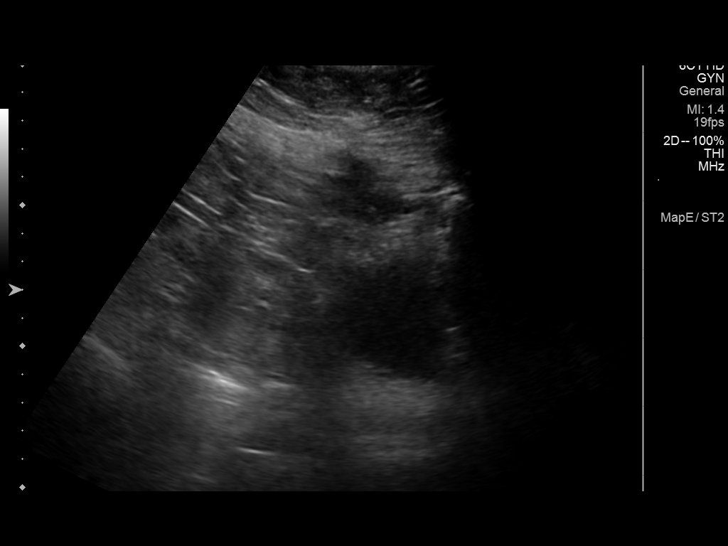
[im 3/20]
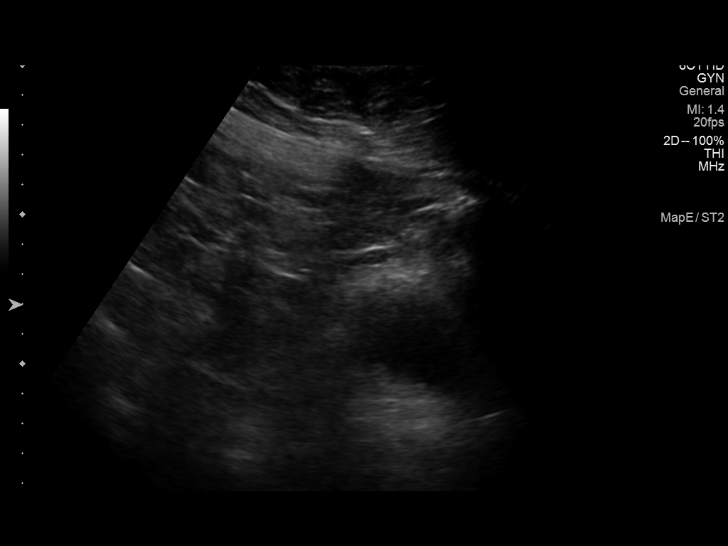
[im 4/20]
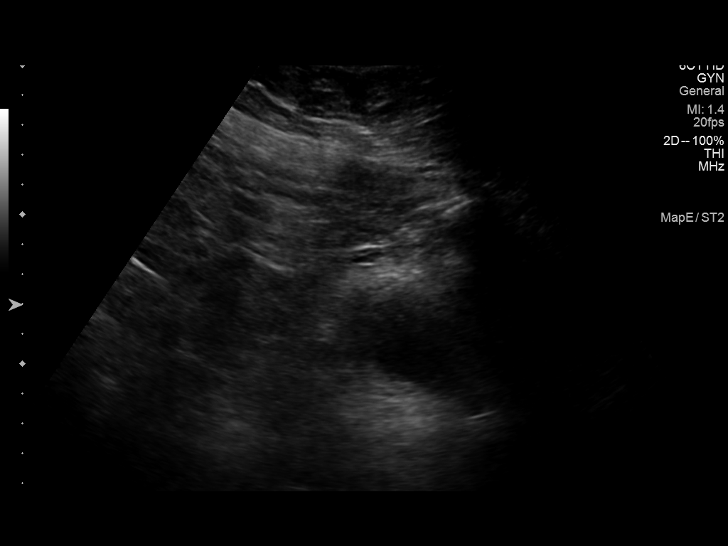
[im 6/20]
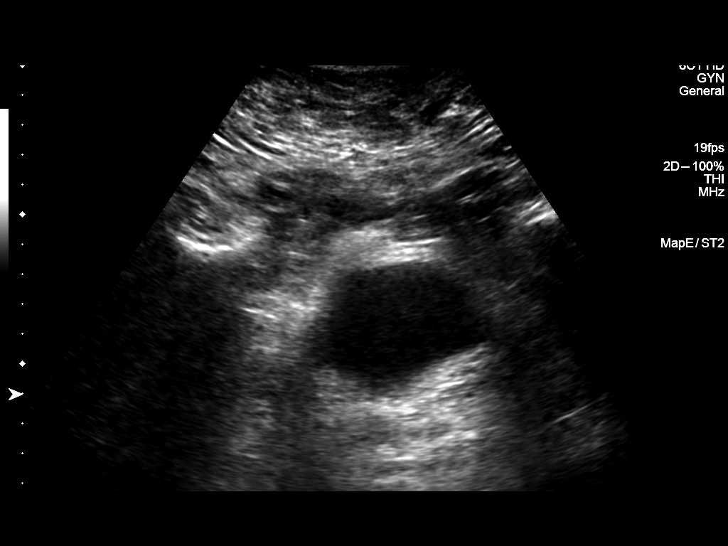
[im 7/20]
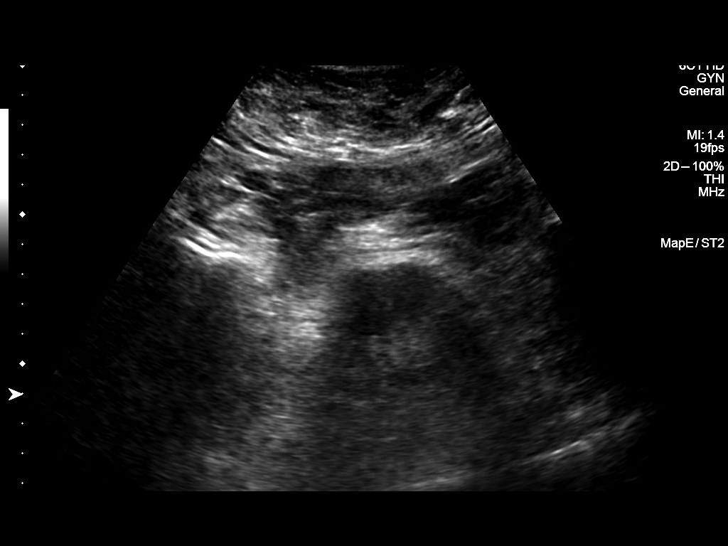
[im 8/20]
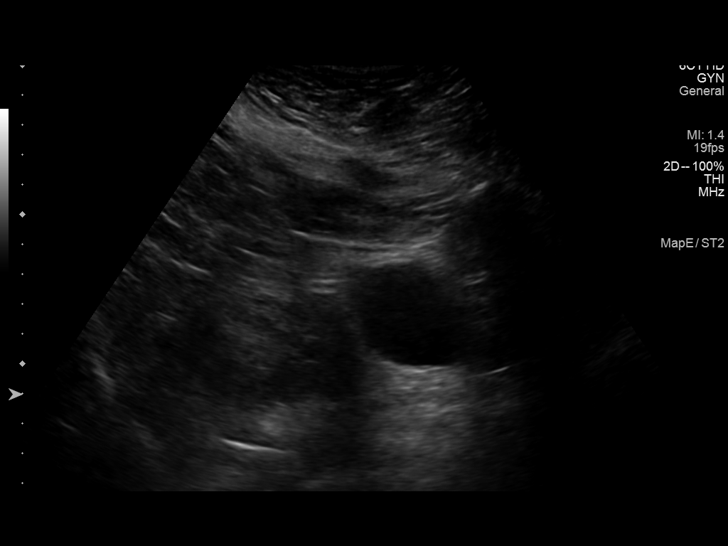
[im 10/20]
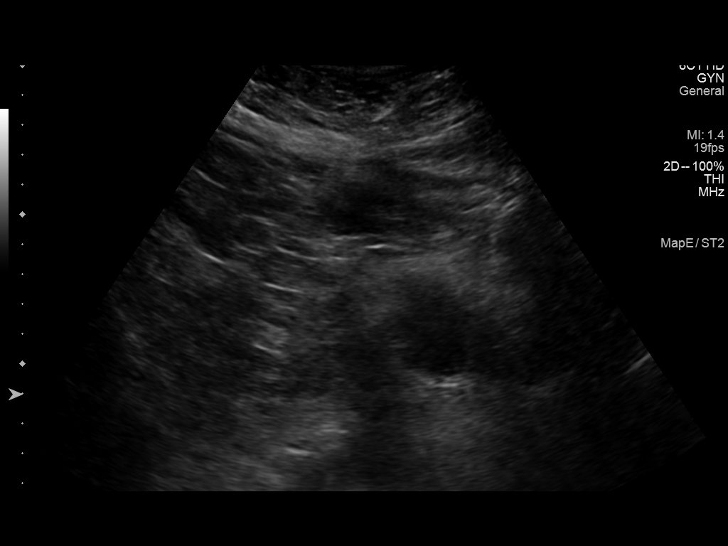
[im 11/20]
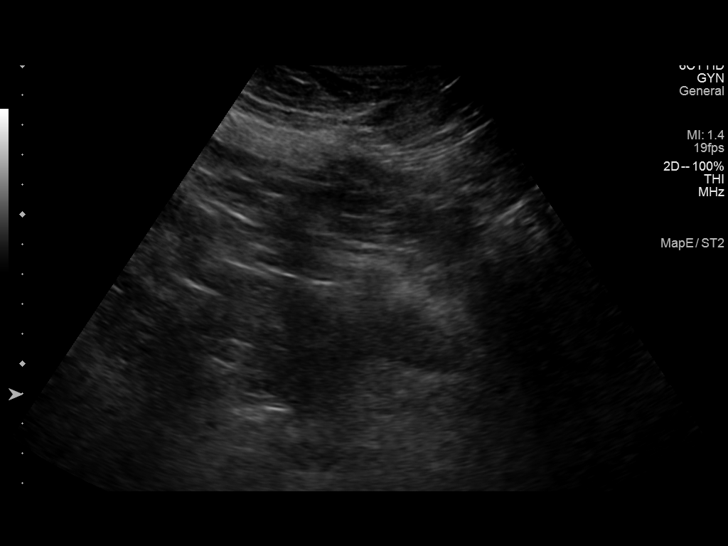
[im 13/20]
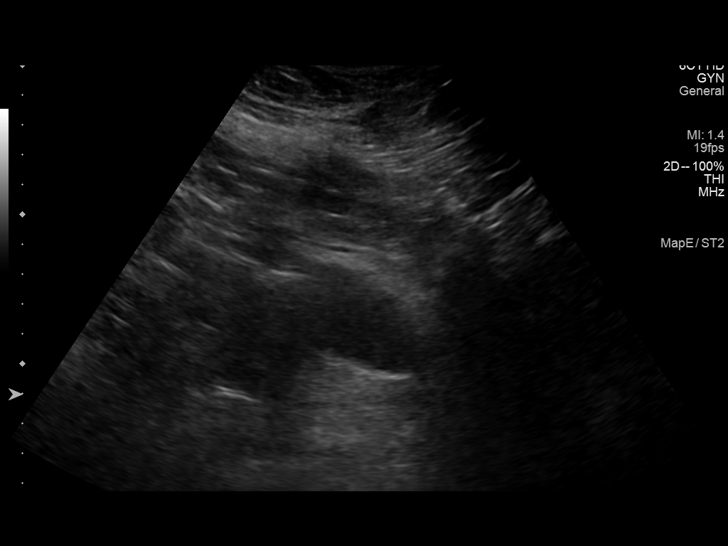
[im 14/20]
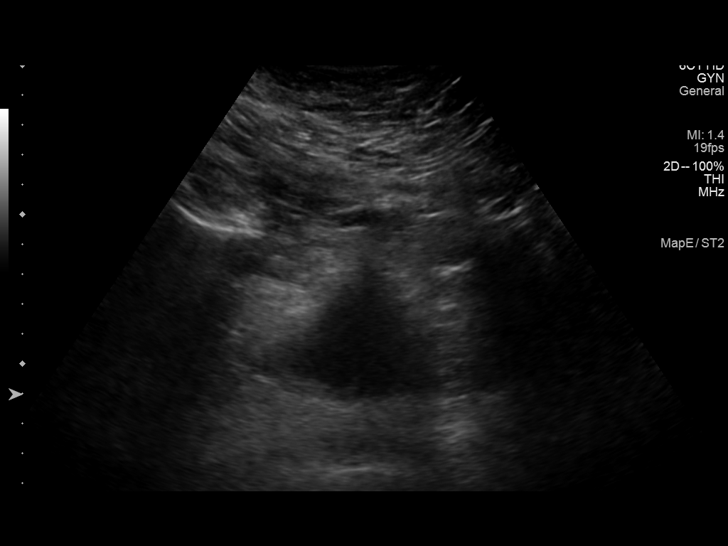
[im 16/20]
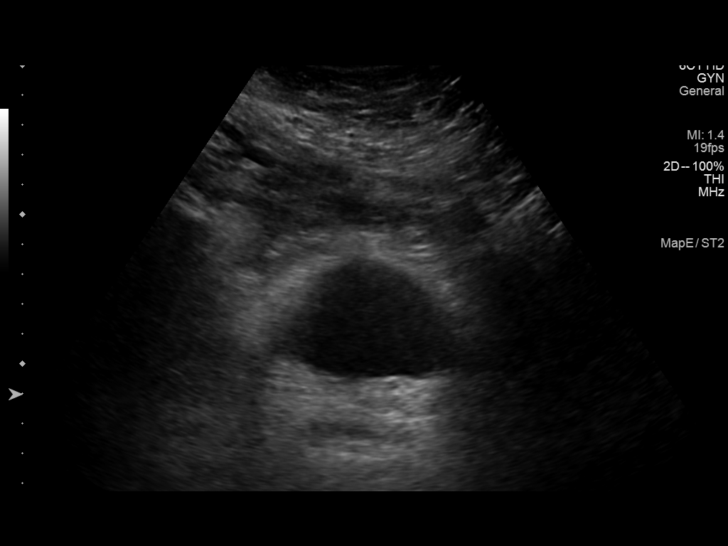
[im 17/20]
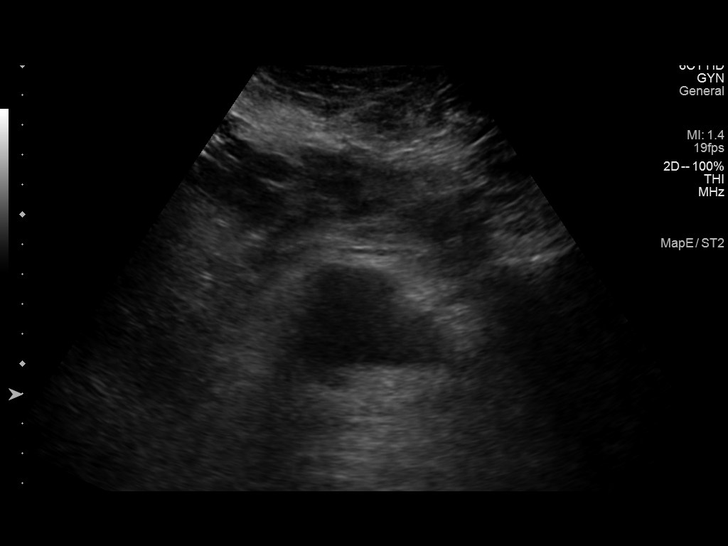
[im 18/20]
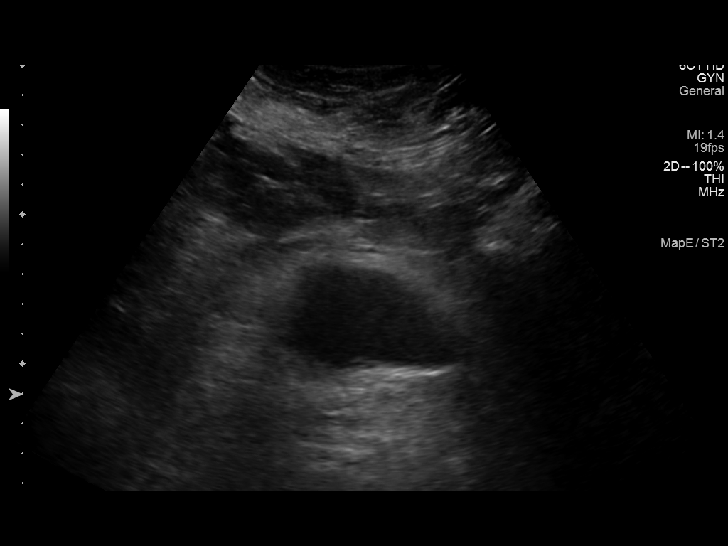
[im 20/20]
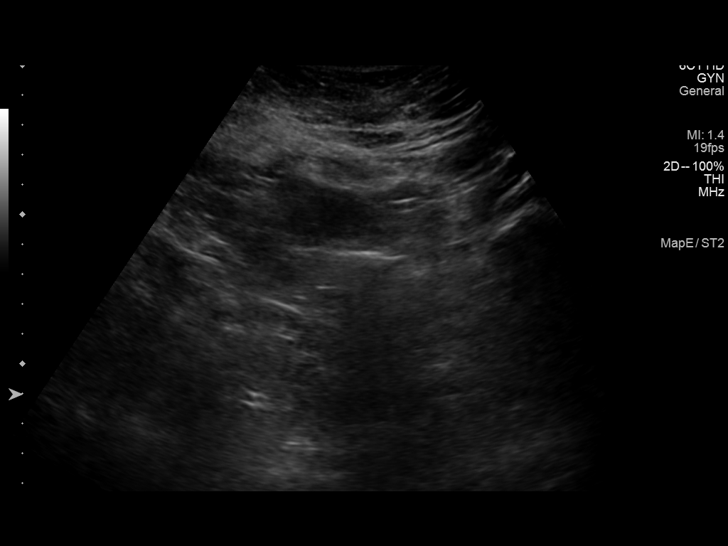

[14 of 20 positions shown; findings below may reference images not displayed]

FINDINGS: Uterus

Surgically absent.

Right ovary

Not visualized.

Left ovary

Not visualized.

Other findings:  No abnormal free fluid.
IMPRESSION: 1. No abnormality seen.
2. Nonvisualized ovaries.
3. Surgically absent uterus.

## 2016-10-25 DIAGNOSIS — N3941 Urge incontinence: Secondary | ICD-10-CM | POA: Diagnosis not present

## 2016-10-25 DIAGNOSIS — N393 Stress incontinence (female) (male): Secondary | ICD-10-CM | POA: Diagnosis not present

## 2016-10-28 DIAGNOSIS — H59811 Chorioretinal scars after surgery for detachment, right eye: Secondary | ICD-10-CM | POA: Diagnosis not present

## 2016-10-28 DIAGNOSIS — H338 Other retinal detachments: Secondary | ICD-10-CM | POA: Diagnosis not present

## 2016-10-28 DIAGNOSIS — H31002 Unspecified chorioretinal scars, left eye: Secondary | ICD-10-CM | POA: Diagnosis not present

## 2016-11-12 DIAGNOSIS — E039 Hypothyroidism, unspecified: Secondary | ICD-10-CM | POA: Diagnosis not present

## 2016-11-26 DIAGNOSIS — N3281 Overactive bladder: Secondary | ICD-10-CM | POA: Diagnosis not present

## 2016-11-26 DIAGNOSIS — E039 Hypothyroidism, unspecified: Secondary | ICD-10-CM | POA: Diagnosis not present

## 2016-11-26 DIAGNOSIS — F0280 Dementia in other diseases classified elsewhere without behavioral disturbance: Secondary | ICD-10-CM | POA: Diagnosis not present

## 2016-11-26 DIAGNOSIS — G301 Alzheimer's disease with late onset: Secondary | ICD-10-CM | POA: Diagnosis not present

## 2016-12-03 DIAGNOSIS — N390 Urinary tract infection, site not specified: Secondary | ICD-10-CM | POA: Diagnosis not present

## 2016-12-03 DIAGNOSIS — N3941 Urge incontinence: Secondary | ICD-10-CM | POA: Diagnosis not present

## 2017-01-21 DIAGNOSIS — N3941 Urge incontinence: Secondary | ICD-10-CM | POA: Diagnosis not present

## 2017-02-25 DIAGNOSIS — E039 Hypothyroidism, unspecified: Secondary | ICD-10-CM | POA: Diagnosis not present

## 2017-07-01 DIAGNOSIS — G301 Alzheimer's disease with late onset: Secondary | ICD-10-CM | POA: Diagnosis not present

## 2017-07-01 DIAGNOSIS — F0280 Dementia in other diseases classified elsewhere without behavioral disturbance: Secondary | ICD-10-CM | POA: Diagnosis not present

## 2017-07-01 DIAGNOSIS — N3281 Overactive bladder: Secondary | ICD-10-CM | POA: Diagnosis not present

## 2017-07-01 DIAGNOSIS — E039 Hypothyroidism, unspecified: Secondary | ICD-10-CM | POA: Diagnosis not present

## 2017-10-27 IMAGING — US US ABDOMEN COMPLETE
1 series · 14 of 25 positions shown · non-contrast
Comparison: [HOSPITAL] Lumbar MRI 04/11/2005

CLINICAL DATA: 81-year-old female with generalized abdominal pain.
Lower abdominal pain for 2 months. Initial encounter.

EXAM:
ABDOMEN ULTRASOUND COMPLETE

[Series 1: us abdomen complete · 0.22mm/px · 14 of 94 slices shown]
[im 1/94]
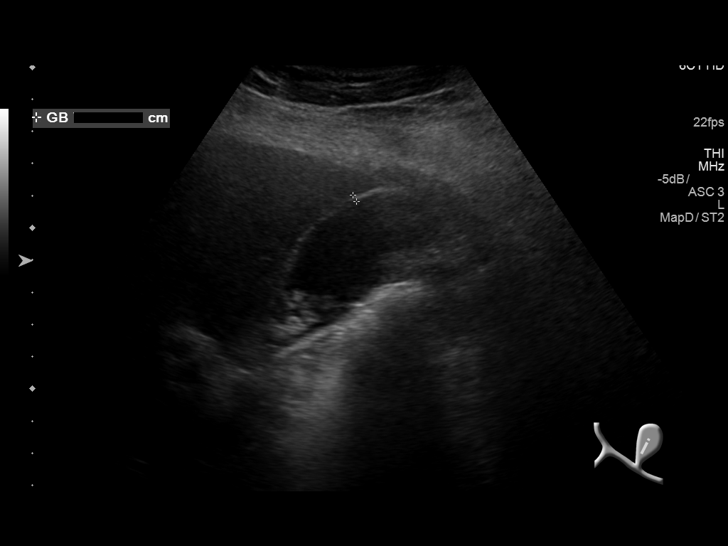
[im 8/94]
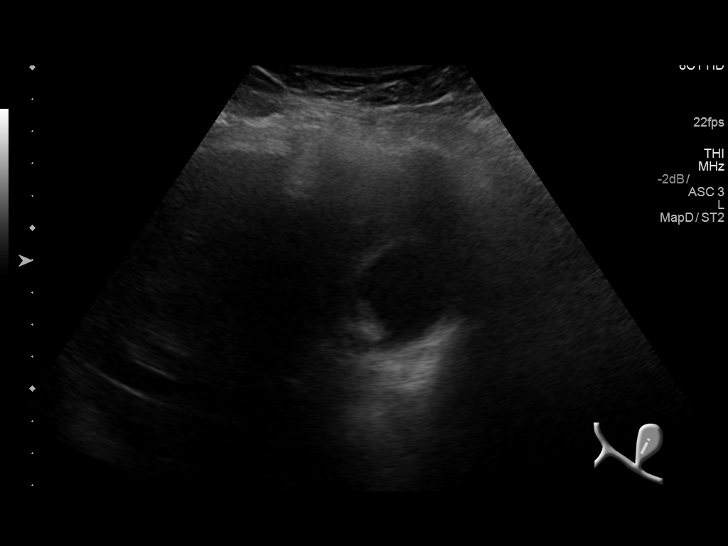
[im 16/94]
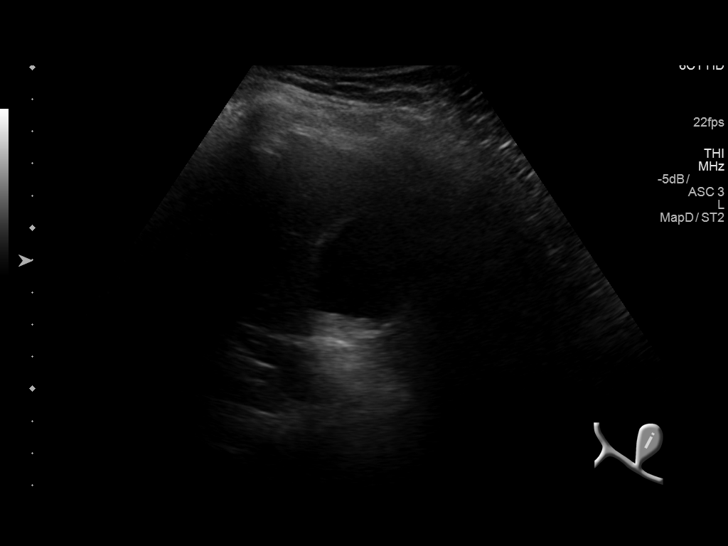
[im 24/94]
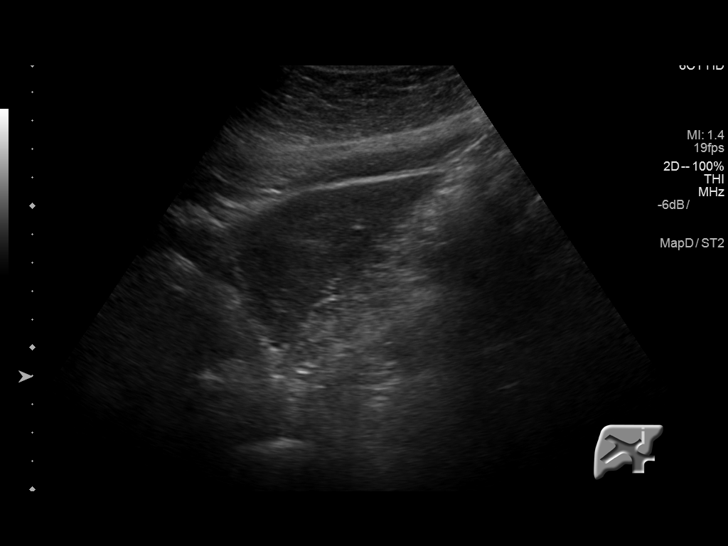
[im 32/94]
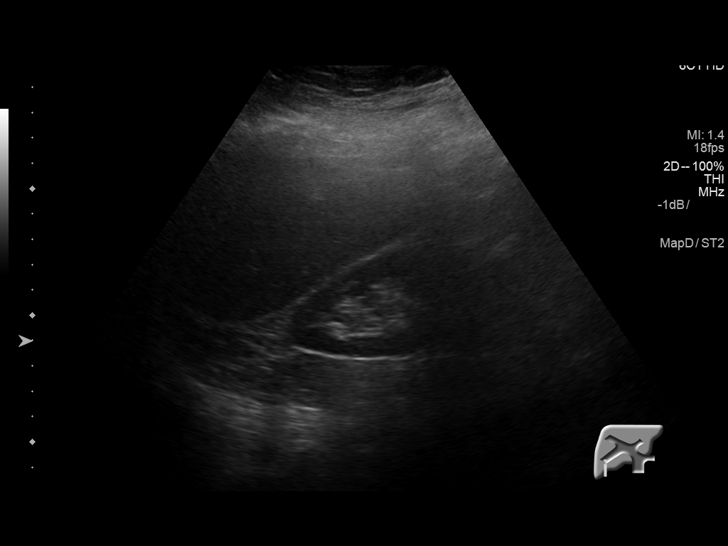
[im 35/94]
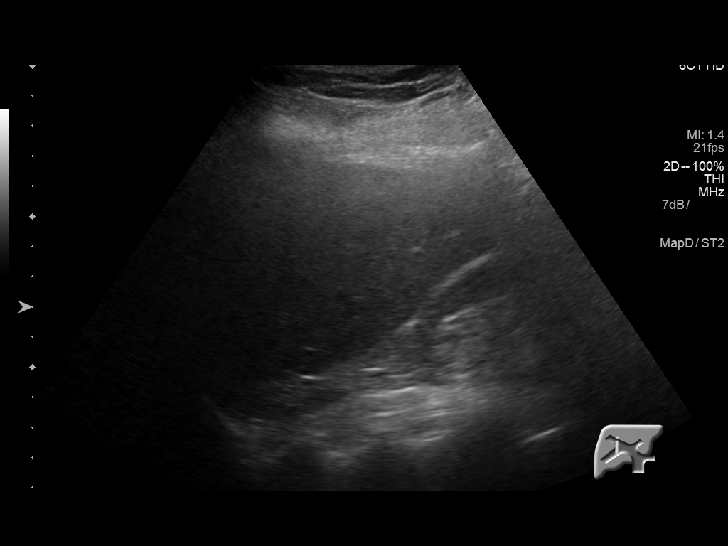
[im 43/94]
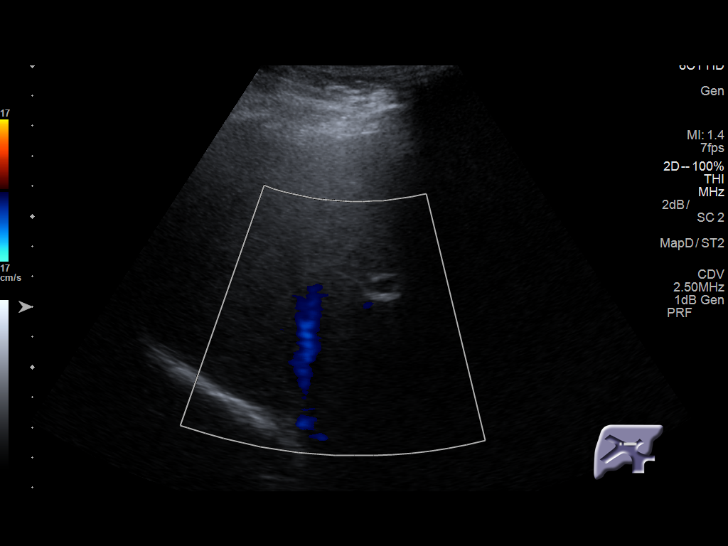
[im 51/94]
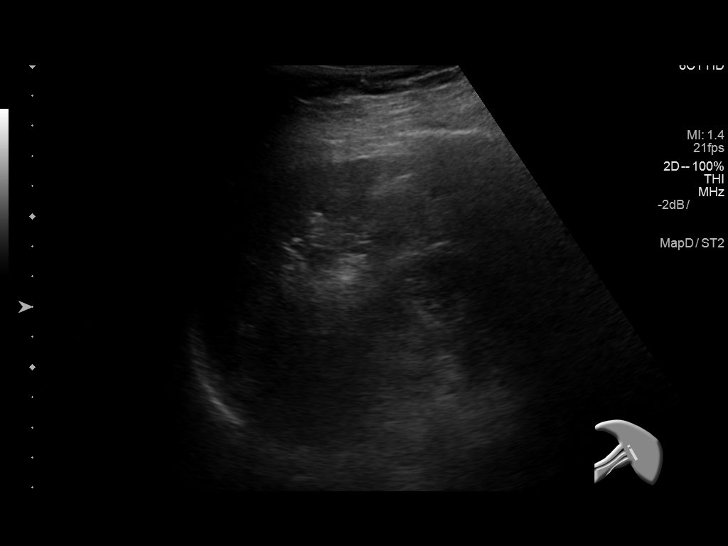
[im 59/94]
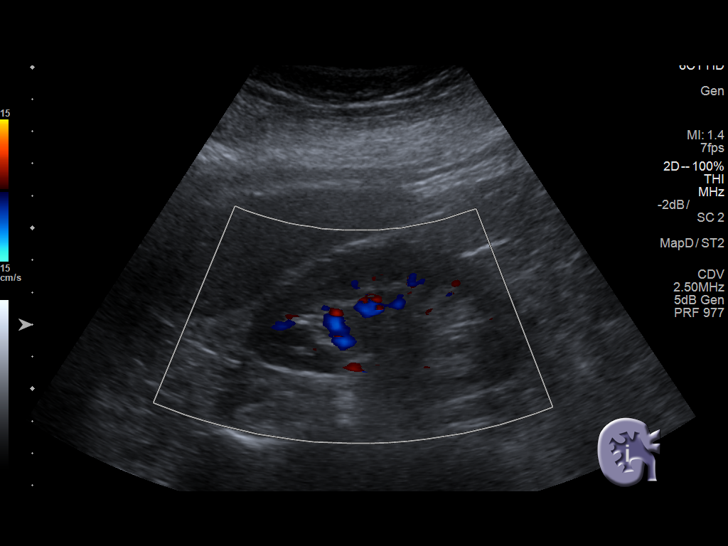
[im 63/94]
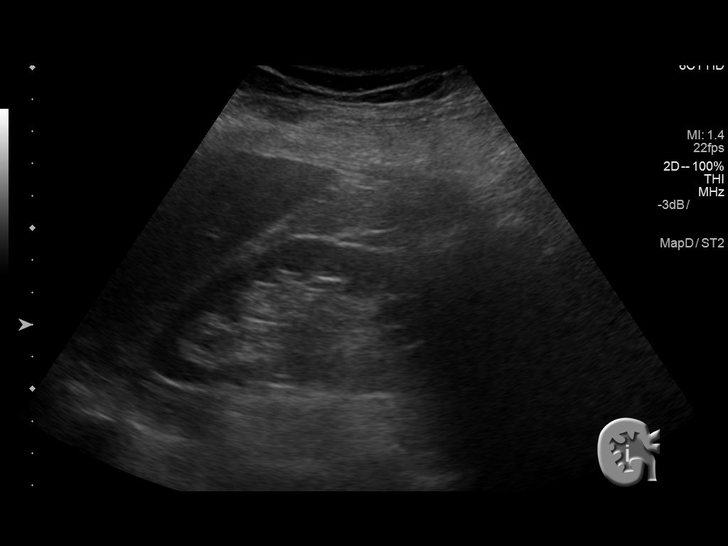
[im 70/94]
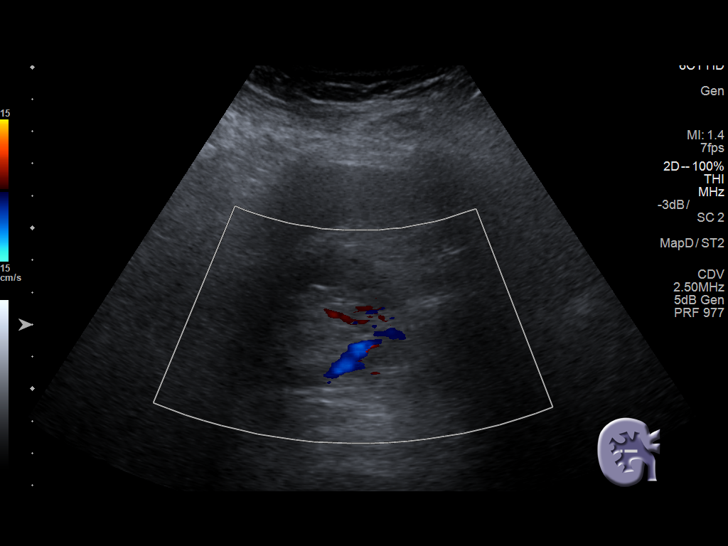
[im 78/94]
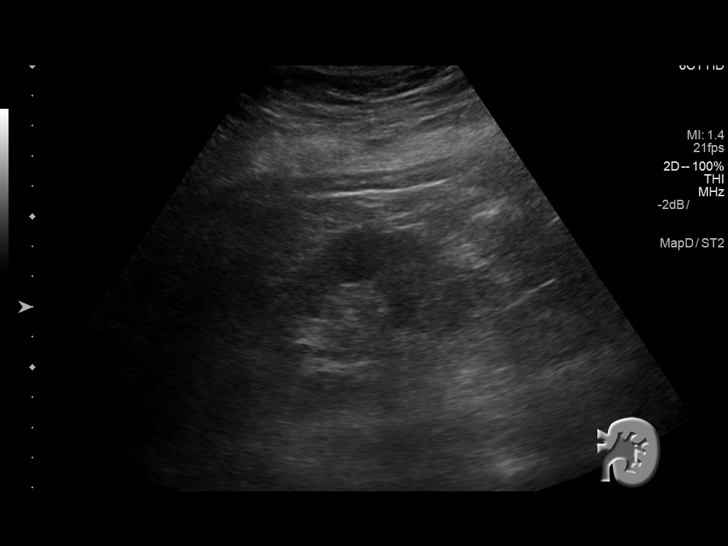
[im 86/94]
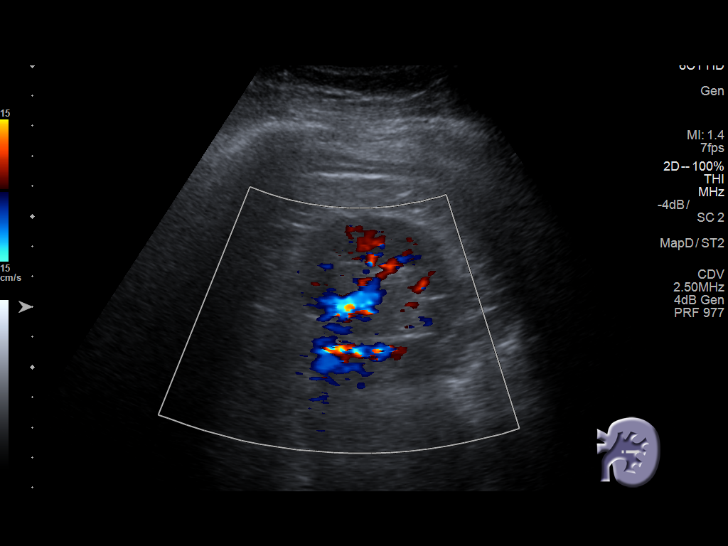
[im 94/94]
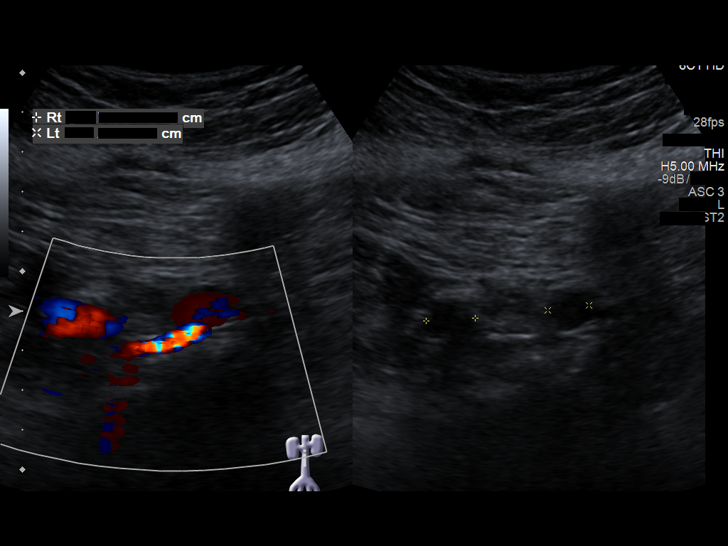

[14 of 25 positions shown; findings below may reference images not displayed]

FINDINGS: Gallbladder: Multiple mobile gallstones, estimated individually at 8
mm diameter. Gallbladder wall thickness remains normal at 2 mm. No
pericholecystic fluid. No sonographic Murphy sign elicited.

Common bile duct: Diameter: 3 mm, normal

Liver: No focal lesion identified. Within normal limits in
parenchymal echogenicity.

IVC: No abnormality visualized.

Pancreas: Visualized portion unremarkable.

Spleen: Size and appearance within normal limits.

Right Kidney: Length: 9.6 cm. Echogenicity within normal limits. No
mass or hydronephrosis visualized.

Left Kidney: Length: 10.0 cm. Small 10 mm upper pole cortical cyst
appears benign on image 90. Echogenicity within normal limits. No
mass or hydronephrosis visualized.

Abdominal aorta: No aneurysm visualized.

Other findings: None.
IMPRESSION: 1. Cholelithiasis. No evidence of acute cholecystitis or biliary
obstruction.
2. Otherwise negative.

## 2017-12-09 ENCOUNTER — Emergency Department: Admit: 2017-12-09 | Payer: MEDICARE

## 2017-12-09 ENCOUNTER — Inpatient Hospital Stay
Admit: 2017-12-09 | Discharge: 2017-12-10 | Disposition: A | Discharge status: Home / Self Care | Payer: MEDICARE | Attending: Emergency Medicine

## 2017-12-09 DIAGNOSIS — N3000 Acute cystitis without hematuria: Secondary | ICD-10-CM

## 2017-12-09 LAB — POC URINE MACROSCOPIC
Bilirubin: NEGATIVE
Glucose: NEGATIVE mg/dl
Ketone: 15 mg/dl — AB
Leukocyte Esterase: NEGATIVE
Nitrites: POSITIVE — AB
Protein: NEGATIVE mg/dl
Specific gravity: 1.025 (ref 1.005–1.030)
Urobilinogen: 0.2 EU/dl (ref 0.0–1.0)
pH (UA): 5.5 (ref 5–9)

## 2017-12-09 LAB — CBC WITH AUTOMATED DIFF
BASOPHILS: 0.4 % (ref 0–3)
EOSINOPHILS: 2.5 % (ref 0–5)
HCT: 43.4 % (ref 37.0–50.0)
HGB: 14.2 gm/dl (ref 13.0–17.2)
IMMATURE GRANULOCYTES: 0.1 % (ref 0.0–3.0)
LYMPHOCYTES: 27.2 % — ABNORMAL LOW (ref 28–48)
MCH: 30.3 pg (ref 25.4–34.6)
MCHC: 32.7 gm/dl (ref 30.0–36.0)
MCV: 92.5 fL (ref 80.0–98.0)
MONOCYTES: 11.3 % (ref 1–13)
MPV: 9.6 fL (ref 6.0–10.0)
NEUTROPHILS: 58.5 % (ref 34–64)
NRBC: 0 (ref 0–0)
PLATELET: 274 10*3/uL (ref 140–450)
RBC: 4.69 M/uL (ref 3.60–5.20)
RDW-SD: 45.2 (ref 36.4–46.3)
WBC: 6.7 10*3/uL (ref 4.0–11.0)

## 2017-12-09 LAB — METABOLIC PANEL, COMPREHENSIVE
ALT (SGPT): 26 U/L (ref 12–78)
AST (SGOT): 20 U/L (ref 15–37)
Albumin: 3.8 gm/dl (ref 3.4–5.0)
Alk. phosphatase: 148 U/L — ABNORMAL HIGH (ref 45–117)
Anion gap: 5 mmol/L (ref 5–15)
BUN: 23 mg/dl (ref 7–25)
Bilirubin, total: 0.4 mg/dl (ref 0.2–1.0)
CO2: 29 mEq/L (ref 21–32)
Calcium: 9.4 mg/dl (ref 8.5–10.1)
Chloride: 106 mEq/L (ref 98–107)
Creatinine: 0.9 mg/dl (ref 0.6–1.3)
GFR est AA: >60
GFR est non-AA: >60
Glucose: 103 mg/dl (ref 74–106)
Potassium: 3.7 mEq/L (ref 3.5–5.1)
Protein, total: 6.8 gm/dl (ref 6.4–8.2)
Sodium: 140 mEq/L (ref 136–145)

## 2017-12-09 LAB — LACTIC ACID: Lactic Acid: 0.8 mmol/L (ref 0.4–2.0)

## 2017-12-09 LAB — PROCALCITONIN: PROCALCITONIN: <0.05 ng/ml (ref 0.00–0.50)

## 2017-12-09 MED ORDER — HALOPERIDOL LACTATE 5 MG/ML IJ SOLN
5 mg/mL | INTRAMUSCULAR | Status: AC
Start: 2017-12-09 — End: 2017-12-09
  Administered 2017-12-09: 23:00:00 via INTRAMUSCULAR

## 2017-12-09 MED ORDER — DIPHENHYDRAMINE HCL 50 MG/ML IJ SOLN
50 mg/mL | INTRAMUSCULAR | Status: AC
Start: 2017-12-09 — End: 2017-12-09
  Administered 2017-12-09: 23:00:00 via INTRAVENOUS

## 2017-12-09 MED FILL — DIPHENHYDRAMINE HCL 50 MG/ML IJ SOLN: 50 mg/mL | INTRAMUSCULAR | Qty: 1

## 2017-12-09 MED FILL — HALOPERIDOL LACTATE 5 MG/ML IJ SOLN: 5 mg/mL | INTRAMUSCULAR | Qty: 1

## 2017-12-09 NOTE — ED Triage Notes (Signed)
pts daughter states pt is confused and seeing things at home. Believes she may have a uti.

## 2017-12-09 NOTE — ED Notes (Addendum)
Pt arrived to ER from home with daughter, that states for the "past month she has been more confused and is seeing things. She did not receive good care when she was living with my  Brother. We dont have any care really and I think she needs to go into a assisted living home or something. She wandered away from home yesterday and the police brought her back to me".     Will consult with care management.    Pt is incontinent and will need a straight cath for urine.

## 2017-12-09 NOTE — ED Provider Notes (Signed)
[name]  Emergency Department Treatment Report    Patient: Angel Reed Age: 84 y.o. Sex: female    Date of Birth: [birth year] Admit Date: 12/09/2017 PCP: None   MRN: [id number]  CSN: [id number]  Attending: Tawanna Cooler, MD   Room: H06/H06 Time Dictated: 11:26 PM        Chief Complaint   Chief Complaint   Patient presents with   ??? Altered mental status       History of Present Illness     Patient with a history of what sounds like dementia, initially lived with her son over 2 months but now living with the daughter for the last 1 month, she has been more difficult to keep safe, had to be brought home by the police yesterday.  Patient has no complaints and does not want to be here but has a vague abdominal symptoms per her report no nausea or vomiting or diarrhea.  Possibly fell a few days ago.  No other complaint at this time    Review of Systems   Review of Systems   Unable to perform ROS: Dementia   Constitutional: Negative for fever.   HENT: Negative for sore throat.    Eyes: Negative for double vision.   Respiratory: Negative for shortness of breath.    Cardiovascular: Negative for chest pain.   Gastrointestinal: Positive for abdominal pain.   Genitourinary: Negative for dysuria.   Musculoskeletal: Negative for back pain.   Skin: Negative for rash.   Neurological: Negative for headaches.       Past Medical/Surgical History   History reviewed. No pertinent past medical history.  History reviewed. No pertinent surgical history.    Social History     Social History     Socioeconomic History   ??? Marital status: WIDOWED     Spouse name: Not on file   ??? Number of children: Not on file   ??? Years of education: Not on file   ??? Highest education level: Not on file   Tobacco Use   ??? Smoking status: Never Smoker   ??? Smokeless tobacco: Never Used   Substance and Sexual Activity   ??? Alcohol use: Never     Frequency: Never       Family History   History reviewed. No pertinent family history.     Current Medications     None       Allergies   No Known Allergies    Physical Exam/ED Course / Medical Decision Making     ED Triage Vitals   Enc Vitals Group      BP 12/09/17 1555 135/62      Pulse (Heart Rate) 12/09/17 1555 65      Resp Rate 12/09/17 1555 18      Temp 12/09/17 1555 98.2 ??F (36.8 ??C)      Temp src --       O2 Sat (%) 12/09/17 1555 97 %      Weight 12/09/17 1552 200 lb      Height 12/09/17 1552 5' 3"      Head Circumference --       Peak Flow --       Pain Score --       Pain Loc --       Pain Edu? --       Excl. in [place]? --        Physical exam:  General:  Well-developed, well-nourished, no apparent distress.  Head:  Normocephalic atraumatic.    Eyes:  Pupils midrange extraocular movements intact.  No pallor or conjunctival injection.    Nose:  No rhinorrhea, inspection grossly normal.    Ears:  Grossly normal to inspection, no discharge.    Mouth:  Mucous membranes moist, no appreciable intraoral lesion.    Neck/Back:  Trachea midline, no asymmetry.    Chest:  Grossly normal inspection, symmetric chest rise.    Pulmonary:  Clear to auscultation bilaterally no wheezes rhonchi or rales.    Cardiovascular:  S1-S2 no murmurs rubs or gallops.    Abdomen: Soft, nontender, nondistended no guarding rebound or peritoneal signs.  Distracted examination completely benign no CVA tenderness  Extremities:  Grossly normal to inspection, peripheral pulses intact    Neurologic:  Alert and oriented ??3  no appreciable focal neurologic deficit    Skin:  Warm and dry  Psychiatric:  Grossly normal mood and affect  Nursing note reviewed, vital signs reviewed.    ED course:  Patient presents with what sounds like worsening dementia fall and what sounds like abdominal pain by her report primary concern for urinary tract infection rule out intracranial injury, seems to have worsening dementia type symptoms we will also consult case management for placement assistance as per the daughter request     Labs unremarkable urinalysis consistent with urinary tract infection, start ceftriaxone.    Goals of care discussed with daughter, she feels that the patient needs to be placed in a long-term care facility or memory care facility, case management consult placed, then that here at this time, place the referral in the EMR, also daughter was given their phone number to help facilitate placement    Agrees with discharge home if she is able to get some further assistance with placement, and that is reasonable as most likely hospitalization would only cause to worsen her dementia UTI encephalopathy.  Daughter did request additional sedating medication to be given to the patient, will take her home once stable    Patient's presentation, history, physical exam and laboratory evaluations were reviewed.  At this time patient was felt to be stable for outpatient management and follow with primary care/specialist.  Patient was instructed to return to the emergency department with any concerns.    Disposition:    Discharged home      Portions of this chart were created with Dragon medical speech to text program.   Unrecognized errors may be present.            Final Diagnosis       ICD-10-CM ICD-9-CM   1. Acute cystitis without hematuria N30.00 595.0   2. Dementia without behavioral disturbance, unspecified dementia type F03.90 294.20         Diagnostic Studies   Lab:   Recent Results (from the past 12 hour(s))   CBC WITH AUTOMATED DIFF    Collection Time: 12/09/17  4:40 PM   Result Value Ref Range    WBC 6.7 4.0 - 11.0 1000/mm3    RBC 4.69 3.60 - 5.20 M/uL    HGB 14.2 13.0 - 17.2 gm/dl    HCT 43.4 37.0 - 50.0 %    MCV 92.5 80.0 - 98.0 fL    MCH 30.3 25.4 - 34.6 pg    MCHC 32.7 30.0 - 36.0 gm/dl    PLATELET 274 140 - 450 1000/mm3    MPV 9.6 6.0 - 10.0 fL    RDW-SD 45.2 36.4 - 46.3  NRBC 0 0 - 0      IMMATURE GRANULOCYTES 0.1 0.0 - 3.0 %    NEUTROPHILS 58.5 34 - 64 %    LYMPHOCYTES 27.2 (L) 28 - 48 %     MONOCYTES 11.3 1 - 13 %    EOSINOPHILS 2.5 0 - 5 %    BASOPHILS 0.4 0 - 3 %   METABOLIC PANEL, COMPREHENSIVE    Collection Time: 12/09/17  4:40 PM   Result Value Ref Range    Sodium 140 136 - 145 mEq/L    Potassium 3.7 3.5 - 5.1 mEq/L    Chloride 106 98 - 107 mEq/L    CO2 29 21 - 32 mEq/L    Glucose 103 74 - 106 mg/dl    BUN 23 7 - 25 mg/dl    Creatinine 0.9 0.6 - 1.3 mg/dl    GFR est AA >60.0      GFR est non-AA >60      Calcium 9.4 8.5 - 10.1 mg/dl    AST (SGOT) 20 15 - 37 U/L    ALT (SGPT) 26 12 - 78 U/L    Alk. phosphatase 148 (H) 45 - 117 U/L    Bilirubin, total 0.4 0.2 - 1.0 mg/dl    Protein, total 6.8 6.4 - 8.2 gm/dl    Albumin 3.8 3.4 - 5.0 gm/dl    Anion gap 5 5 - 15 mmol/L   LACTIC ACID    Collection Time: 12/09/17  5:41 PM   Result Value Ref Range    Lactic Acid 0.8 0.4 - 2.0 mmol/L   PROCALCITONIN    Collection Time: 12/09/17  5:41 PM   Result Value Ref Range    PROCALCITONIN <0.05 0.00 - 0.50 ng/ml   POC URINE MACROSCOPIC    Collection Time: 12/09/17  7:49 PM   Result Value Ref Range    Glucose Negative NEGATIVE,Negative mg/dl    Bilirubin Negative NEGATIVE,Negative      Ketone 15 (A) NEGATIVE,Negative mg/dl    Specific gravity 1.025 1.005 - 1.030      Blood Trace-lysed (A) NEGATIVE,Negative      pH (UA) 5.5 5 - 9      Protein Negative NEGATIVE,Negative mg/dl    Urobilinogen 0.2 0.0 - 1.0 EU/dl    Nitrites Positive (A) NEGATIVE,Negative      Leukocyte Esterase Negative NEGATIVE,Negative      Color Yellow      Appearance Clear     POC URINE MICROSCOPIC    Collection Time: 12/09/17  7:49 PM   Result Value Ref Range    Epithelial cells, squamous 5-9 /LPF    WBC 5-9 /HPF    RBC OCCASIONAL /HPF    Bacteria 2+ /HPF       Imaging:    Xr Chest Sngl V    Result Date: 12/09/2017  Indication: Altered mental status Frontal view chest The heart is normal in size. Lungs are clear and the bony thorax is intact. No effusions or other abnormalities are seen.     Impression: Normal study.     Ct Head Wo Cont     Result Date: 12/09/2017  DICOM format image data is available to nonaffiliated external healthcare facilities or entities on a secure, media free, reciprocally searchable basis with patient authorization for 12 months following the date of the study. Indication: Altered level of consciousness Technique:  5 millimeter axial images without contrast were obtained through the brain. Comparison: none Findings:   There is generalized  moderate cerebral atrophy.  Low attenuation surrounds the lateral ventricles.  No hemorrhage, mass, or infarct. Sella, pineal, craniocervical junction, orbits, paranasal sinuses, and temporal bones are remarkable for right ocular prosthesis. Bony structures intact.     Impression: cerebral atrophy and nonspecific white matter changes not unusual for age; no acute abnormality.                  Medications   haloperidol lactate (HALDOL) injection 3 mg (3 mg IntraMUSCular Given 12/09/17 1843)   diphenhydrAMINE (BENADRYL) injection 25 mg (25 mg IntraVENous Given 12/09/17 1843)   cefTRIAXone (ROCEPHIN) 1 g in 0.9% sodium chloride (MBP/ADV) 50 mL MBP (1 g IntraVENous New Bag 12/09/17 2048)   LORazepam (ATIVAN) injection 1 mg (1 mg IntraVENous Given 12/09/17 2047)          Results of lab/radiology tests were discussed with the patient. . All questions were answered and concerns addressed.        My signature above authenticates this document and my orders, the final ??  diagnosis (es), discharge prescription (s), and instructions in the Epic record.  If you have any questions please contact [phone].  ??

## 2017-12-09 NOTE — ED Notes (Signed)
Report received from Chassidy, RN.

## 2017-12-09 NOTE — ED Notes (Signed)
Pt medicated as ordered; see MAR.     To CT via stretcher.

## 2017-12-09 NOTE — ED Notes (Signed)
Report received by Kalisha (offgoing RN)

## 2017-12-09 NOTE — ED Notes (Signed)
Attempted to get patient into wheelchair for D/C. Patient was lethargic due to ativan. Charge nurse notified and allowed patient to remain in the bed in hallway 6 until patient is alert enough for discharge

## 2017-12-09 NOTE — ED Notes (Signed)
Patient ambulated to the bathroom with 2 assist.

## 2017-12-09 NOTE — ED Notes (Signed)
Xray @ bedside

## 2017-12-10 LAB — POC URINE MICROSCOPIC

## 2017-12-10 MED ORDER — LORAZEPAM 2 MG/ML IJ SOLN
2 mg/mL | INTRAMUSCULAR | Status: AC
Start: 2017-12-10 — End: 2017-12-09
  Administered 2017-12-10: 01:00:00 via INTRAVENOUS

## 2017-12-10 MED ORDER — CEPHALEXIN 500 MG CAP
500 mg | ORAL_CAPSULE | Freq: Three times a day (TID) | ORAL | 0 refills | Status: DC
Start: 2017-12-10 — End: 2017-12-29

## 2017-12-10 MED ORDER — CEFTRIAXONE 1 GRAM SOLUTION FOR INJECTION
1 gram | INTRAMUSCULAR | Status: AC
Start: 2017-12-10 — End: 2017-12-09
  Administered 2017-12-10: 01:00:00 via INTRAVENOUS

## 2017-12-10 MED FILL — LORAZEPAM 2 MG/ML IJ SOLN: 2 mg/mL | INTRAMUSCULAR | Qty: 1

## 2017-12-10 MED FILL — CEFTRIAXONE 1 GRAM SOLUTION FOR INJECTION: 1 gram | INTRAMUSCULAR | Qty: 1

## 2017-12-10 NOTE — ED Notes (Signed)
1:49 AM  12/10/17     Discharge instructions given to daughter (name) with verbalization of understanding. Patient accompanied by daughter.  Patient discharged with the following prescriptions KEFLEX. Patient discharged to home (destination).      Brantley Fling Milanes

## 2017-12-10 NOTE — Progress Notes (Signed)
Spoke at length with daughter Merri Dimaano concerning available services for the care of her mother.  She expressed great concern with her increasing needs as her dementia progresses.   Provided information for [facility] Coordination-  Elder Sport and exercise psychologist and ALF  Emotional support provided to daughter

## 2017-12-16 DIAGNOSIS — G309 Alzheimer's disease, unspecified: Secondary | ICD-10-CM
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
## 2017-12-16 NOTE — ED Notes (Signed)
Attempted to call daughter. No answer

## 2017-12-16 NOTE — ED Notes (Signed)
Arrived by EMS. Called to home due to pt being combative and aggressive toward daughter. Pt has history of Dementia with increased aggressive behavior and wandering. Diagnosed with UTI last week.

## 2017-12-16 NOTE — H&P (Signed)
Medicine History and Physical  Patient: Angel Reed Age: 84 y.o. Sex: female    Date of Birth: 1933-08-20 Admit Date: 12/16/2017 PCP: None   MRN: [id number]  CSN: [id number]         Assessment   Behavioral disturbance   UTI  Agitation   Dementia       Plan   PT evaluation  Psychiatry evaluation  Fall precautions   Neuro Checks   1:1 Sitter  Care management   Ativan PRN  Diet Cardiac   DVT PPX Heparin sq  ACP: CODE STATUS FULL CODE    Chief Complaint:  Chief Complaint   Patient presents with   ??? Altered mental status     Daughter trying to ge her medicine for dimentia.   ??? Mental Health Problem   ??? Alzheimers         HPI:   Angel Reed is a 84 y.o. year old female who presents with  being combative and aggressive toward daughter. Pt has history of Dementia with increased aggressive behavior and wandering. Diagnosed with UTI last week. Patient has bruise on face. Unable to contact family.   CT head -ve.     Review of Systems - 12 Point ROS -unable to obatein due to patient condition.    Past Medical History:  Past Medical History:   Diagnosis Date   ??? Agitation    ??? Dementia    ??? UTI (urinary tract infection)        Past Surgical History:  History reviewed. No pertinent surgical history.    Family History:  No family history on file.    Social History:  Social History     Socioeconomic History   ??? Marital status: WIDOWED     Spouse name: Not on file   ??? Number of children: Not on file   ??? Years of education: Not on file   ??? Highest education level: Not on file   Tobacco Use   ??? Smoking status: Never Smoker   ??? Smokeless tobacco: Never Used   Substance and Sexual Activity   ??? Alcohol use: Never     Frequency: Never   ??? Drug use: Never       Home Medications:  Prior to Admission medications    Medication Sig Start Date End Date Taking? Authorizing Provider   cephALEXin (KEFLEX) 500 mg capsule Take 1 Cap by mouth three (3) times daily for 7 days. 12/09/17 12/16/17  Despina Hick, MD       Allergies:  Allergies    Allergen Reactions   ??? Aspirin Unknown (comments)         Physical Exam:     Visit Vitals  BP (!) 142/108   Pulse (!) 59   Temp 97.3 ??F (36.3 ??C)   Resp 18   Ht  (1.626 m)   Wt 90.7 kg (200 lb)   SpO2 97%   BMI 34.33 kg/m??       Physical Exam:  General appearance: alert, cooperative, no distress, appears stated age  Head: Normocephalic, without obvious abnormality, atraumatic  Neck: supple, trachea midline  Lungs: clear to auscultation bilaterally  Heart: regular rate and rhythm, S1, S2 normal, no murmur, click, rub or gallop  Abdomen: soft, non-tender. Bowel sounds normal. No masses,  no organomegaly  Extremities: extremities normal, atraumatic, no cyanosis or edema  Skin: Skin color, texture, turgor normal. No rashes or lesions  Neurologic: alert to person only     Intake  and Output:  Current Shift:  No intake/output data recorded.  Last three shifts:  No intake/output data recorded.    Lab/Data Reviewed:  Lab:   Recent Results (from the past 12 hour(s))   CBC WITH AUTOMATED DIFF    Collection Time: 12/16/17 10:30 PM   Result Value Ref Range    WBC 8.6 4.0 - 11.0 1000/mm3    RBC 4.37 3.60 - 5.20 M/uL    HGB 13.1 13.0 - 17.2 gm/dl    HCT 16.1 09.6 - 04.5 %    MCV 90.4 80.0 - 98.0 fL    MCH 30.0 25.4 - 34.6 pg    MCHC 33.2 30.0 - 36.0 gm/dl    PLATELET 409 811 - 914 1000/mm3    MPV 10.0 6.0 - 10.0 fL    RDW-SD 43.8 36.4 - 46.3      NRBC 0 0 - 0      IMMATURE GRANULOCYTES 0.2 0.0 - 3.0 %    NEUTROPHILS 59.2 34 - 64 %    LYMPHOCYTES 23.7 (L) 28 - 48 %    MONOCYTES 14.7 (H) 1 - 13 %    EOSINOPHILS 1.5 0 - 5 %    BASOPHILS 0.7 0 - 3 %   METABOLIC PANEL, BASIC    Collection Time: 12/16/17 10:30 PM   Result Value Ref Range    Sodium 141 136 - 145 mEq/L    Potassium 3.4 (L) 3.5 - 5.1 mEq/L    Chloride 107 98 - 107 mEq/L    CO2 31 21 - 32 mEq/L    Glucose 101 74 - 106 mg/dl    BUN 19 7 - 25 mg/dl    Creatinine 0.9 0.6 - 1.3 mg/dl    GFR est AA >78.2      GFR est non-AA >60      Calcium 9.5 8.5 - 10.1 mg/dl     Anion gap 4 (L) 5 - 15 mmol/L   POC URINE MACROSCOPIC    Collection Time: 12/16/17 11:00 PM   Result Value Ref Range    Glucose Negative NEGATIVE,Negative mg/dl    Bilirubin Small (A) NEGATIVE,Negative      Ketone 15 (A) NEGATIVE,Negative mg/dl    Specific gravity >=9.562 1.005 - 1.030      Blood Negative NEGATIVE,Negative      pH (UA) 5.0 5 - 9      Protein 30 (A) NEGATIVE,Negative mg/dl    Urobilinogen 0.2 0.0 - 1.0 EU/dl    Nitrites Negative NEGATIVE,Negative      Leukocyte Esterase Negative NEGATIVE,Negative      Color Yellow      Appearance Clear         Imaging:    No results found.      Irving Burton, MD  Dec 16, 2017

## 2017-12-17 ENCOUNTER — Inpatient Hospital Stay
Admit: 2017-12-17 | Discharge: 2017-12-29 | Disposition: A | Discharge status: Hospice | Payer: MEDICARE | Attending: Internal Medicine | Admitting: Internal Medicine

## 2017-12-17 LAB — POC URINE MACROSCOPIC
Blood: NEGATIVE
Glucose: NEGATIVE mg/dl
Ketone: 15 mg/dl — AB
Leukocyte Esterase: NEGATIVE
Nitrites: NEGATIVE
Protein: 30 mg/dl — AB
Specific gravity: >=1.03 (ref 1.005–1.030)
Urobilinogen: 0.2 EU/dl (ref 0.0–1.0)
pH (UA): 5 (ref 5–9)

## 2017-12-17 LAB — METABOLIC PANEL, BASIC
Anion gap: 4 mmol/L — ABNORMAL LOW (ref 5–15)
BUN: 19 mg/dl (ref 7–25)
CO2: 31 mEq/L (ref 21–32)
Calcium: 9.5 mg/dl (ref 8.5–10.1)
Chloride: 107 mEq/L (ref 98–107)
Creatinine: 0.9 mg/dl (ref 0.6–1.3)
GFR est AA: >60
GFR est non-AA: >60
Glucose: 101 mg/dl (ref 74–106)
Potassium: 3.4 mEq/L — ABNORMAL LOW (ref 3.5–5.1)
Sodium: 141 mEq/L (ref 136–145)

## 2017-12-17 LAB — CBC WITH AUTOMATED DIFF
BASOPHILS: 0.7 % (ref 0–3)
EOSINOPHILS: 1.5 % (ref 0–5)
HCT: 39.5 % (ref 37.0–50.0)
HGB: 13.1 gm/dl (ref 13.0–17.2)
IMMATURE GRANULOCYTES: 0.2 % (ref 0.0–3.0)
LYMPHOCYTES: 23.7 % — ABNORMAL LOW (ref 28–48)
MCH: 30 pg (ref 25.4–34.6)
MCHC: 33.2 gm/dl (ref 30.0–36.0)
MCV: 90.4 fL (ref 80.0–98.0)
MONOCYTES: 14.7 % — ABNORMAL HIGH (ref 1–13)
MPV: 10 fL (ref 6.0–10.0)
NEUTROPHILS: 59.2 % (ref 34–64)
NRBC: 0 (ref 0–0)
PLATELET: 278 10*3/uL (ref 140–450)
RBC: 4.37 M/uL (ref 3.60–5.20)
RDW-SD: 43.8 (ref 36.4–46.3)
WBC: 8.6 10*3/uL (ref 4.0–11.0)

## 2017-12-17 MED ORDER — LORAZEPAM 1 MG TAB
1 mg | Freq: Once | ORAL | Status: AC
Start: 2017-12-17 — End: 2017-12-16
  Administered 2017-12-17: 04:00:00 via ORAL

## 2017-12-17 MED ORDER — HEPARIN (PORCINE) 5,000 UNIT/ML IJ SOLN
5000 unit/mL | Freq: Two times a day (BID) | INTRAMUSCULAR | Status: DC
Start: 2017-12-17 — End: 2017-12-26
  Administered 2017-12-17 – 2017-12-26 (×18): via SUBCUTANEOUS

## 2017-12-17 MED ORDER — ONDANSETRON (PF) 4 MG/2 ML INJECTION
4 mg/2 mL | INTRAMUSCULAR | Status: DC | PRN
Start: 2017-12-17 — End: 2017-12-26

## 2017-12-17 MED ORDER — SODIUM CHLORIDE 0.9 % IJ SYRG
INTRAMUSCULAR | Status: DC | PRN
Start: 2017-12-17 — End: 2017-12-29

## 2017-12-17 MED ORDER — NALOXONE 0.4 MG/ML INJECTION
0.4 mg/mL | INTRAMUSCULAR | Status: DC | PRN
Start: 2017-12-17 — End: 2017-12-26

## 2017-12-17 MED ORDER — SODIUM CHLORIDE 0.9 % IJ SYRG
Freq: Three times a day (TID) | INTRAMUSCULAR | Status: DC
Start: 2017-12-17 — End: 2017-12-26
  Administered 2017-12-18 – 2017-12-25 (×22): via INTRAVENOUS

## 2017-12-17 MED ORDER — NYSTATIN 100,000 UNIT/G TOPICAL POWDER
100000 unit/gram | Freq: Two times a day (BID) | CUTANEOUS | Status: DC
Start: 2017-12-17 — End: 2017-12-29
  Administered 2017-12-18 – 2017-12-29 (×22): via TOPICAL

## 2017-12-17 MED ORDER — ACETAMINOPHEN 325 MG TABLET
325 mg | Freq: Four times a day (QID) | ORAL | Status: DC | PRN
Start: 2017-12-17 — End: 2017-12-26
  Administered 2017-12-19: 01:00:00 via ORAL

## 2017-12-17 MED FILL — NYSTOP 100,000 UNIT/GRAM TOPICAL POWDER: 100000 unit/gram | CUTANEOUS | Qty: 15

## 2017-12-17 MED FILL — LORAZEPAM 1 MG TAB: 1 mg | ORAL | Qty: 1

## 2017-12-17 MED FILL — HEPARIN (PORCINE) 5,000 UNIT/ML IJ SOLN: 5000 unit/mL | INTRAMUSCULAR | Qty: 1

## 2017-12-17 NOTE — Progress Notes (Signed)
Consult: Chaplain was not able to assess the pt. She doesn't engage in conversation. The daughter wasn't present to give any information. Will continue to follow if admitted.

## 2017-12-17 NOTE — Progress Notes (Signed)
SW consulted for concerns with home environment and care in the home    SW following patient.  Currently speaking with the daughter, Angel Reed.  Dictation will follow

## 2017-12-17 NOTE — ED Notes (Signed)
Patient ambulated to restroom with assistance of one person, returned to room and placed in chair for lunch.  Patient did not feed herself but was fed and ate only a small amount of food.  Patient states "I don't like any of my breakfast."

## 2017-12-17 NOTE — Progress Notes (Signed)
Medical Progress Note      NAME: Angel Reed   DOB:  [birth year]  MRN:             [id number]    Date/Time: 12/17/2017  12:12 PM      Assessment:     1. Behavioral disturbance   2. Agitation   3. Dementia     Plan:   ?? Patient was admitted last night  ?? Continue to monitor her mental status  ?? Continue current medical management  ?? Patient will need placement  ?? We will consult PT OT    Subjective:   Confusion and disorientation    Objective:     Vitals:    Last 24hrs VS reviewed since prior progress note. Most recent are:  Visit Vitals  BP 129/50 (BP 1 Location: Right arm, BP Patient Position: At rest;Head of bed elevated (Comment degrees))   Pulse 78   Temp 97.9 ??F (36.6 ??C)   Resp 16   Ht  (1.626 m)   Wt 90.7 kg (200 lb)   SpO2 99%   BMI 34.33 kg/m??     SpO2 Readings from Last 6 Encounters:   12/17/17 99%   12/09/17 96%        No intake or output data in the 24 hours ending 12/17/17 1212     Exam:     General:   Not in acute distress  HEENT: PERRLA, Neck Supple,  No JVD  Respiratory:   CTA bilaterally-no wheezes, rales, rhonchi, or crackles  Cardiac:  Regular Rate and Rythmn  - no murmurs, rubs or gallops  Abdominal:  Soft, non-tender, non-distended, positive bowel sounds  Extremities:  No cyanosis, or edema.  Skin: No rash  Neurological:  No focal neurological deficits    Medication:   Current Medications Reviewed    Current Facility-Administered Medications:   ???  ondansetron (ZOFRAN) injection 4 mg, 4 mg, IntraVENous, Q4H PRN, Gwenyth Allegra, MD  ???  heparin (porcine) injection 5,000 Units, 5,000 Units, SubCUTAneous, Q12H, Earlie Lou, Muhamed, MD, 5,000 Units at 12/17/17 1040  No current outpatient medications on file.      Lab:     Lab Data Reviewed: (see below)  Recent Results (from the past 24 hour(s))   CBC WITH AUTOMATED DIFF    Collection Time: 12/16/17 10:30 PM   Result Value Ref Range    WBC 8.6 4.0 - 11.0 1000/mm3    RBC 4.37 3.60 - 5.20 M/uL    HGB 13.1 13.0 - 17.2 gm/dl     HCT 45.4 09.8 - 11.9 %    MCV 90.4 80.0 - 98.0 fL    MCH 30.0 25.4 - 34.6 pg    MCHC 33.2 30.0 - 36.0 gm/dl    PLATELET 147 829 - 562 1000/mm3    MPV 10.0 6.0 - 10.0 fL    RDW-SD 43.8 36.4 - 46.3      NRBC 0 0 - 0      IMMATURE GRANULOCYTES 0.2 0.0 - 3.0 %    NEUTROPHILS 59.2 34 - 64 %    LYMPHOCYTES 23.7 (L) 28 - 48 %    MONOCYTES 14.7 (H) 1 - 13 %    EOSINOPHILS 1.5 0 - 5 %    BASOPHILS 0.7 0 - 3 %   METABOLIC PANEL, BASIC    Collection Time: 12/16/17 10:30 PM   Result Value Ref Range    Sodium 141 136 - 145 mEq/L    Potassium 3.4 (L)  3.5 - 5.1 mEq/L    Chloride 107 98 - 107 mEq/L    CO2 31 21 - 32 mEq/L    Glucose 101 74 - 106 mg/dl    BUN 19 7 - 25 mg/dl    Creatinine 0.9 0.6 - 1.3 mg/dl    GFR est AA >09.8      GFR est non-AA >60      Calcium 9.5 8.5 - 10.1 mg/dl    Anion gap 4 (L) 5 - 15 mmol/L   POC URINE MACROSCOPIC    Collection Time: 12/16/17 11:00 PM   Result Value Ref Range    Glucose Negative NEGATIVE,Negative mg/dl    Bilirubin Small (A) NEGATIVE,Negative      Ketone 15 (A) NEGATIVE,Negative mg/dl    Specific gravity >=1.191 1.005 - 1.030      Blood Negative NEGATIVE,Negative      pH (UA) 5.0 5 - 9      Protein 30 (A) NEGATIVE,Negative mg/dl    Urobilinogen 0.2 0.0 - 1.0 EU/dl    Nitrites Negative NEGATIVE,Negative      Leukocyte Esterase Negative NEGATIVE,Negative      Color Yellow      Appearance Clear         No results found.    Active Problems:    Senile dementia with acute confusional state, with behavioral disturbance (12/16/2017)      ____________________________________________________________________      Attending Physician: Theodis Aguas, MD

## 2017-12-17 NOTE — ED Notes (Signed)
Patient received breakfast tray but was not able to initiate eating on her own.  Patient assisted to chair and tray place on bedside table set up for patient to eat.  Patient tried to eat the blanket that was around her shoulders for warmth.  Patient was fed a few bites of scrambled eggs and english muffin with butter, given some sips of orange juice and coffee.  After a few bites the patient began to hold her own fork and took a few more bites of eggs.  Patient was given the english muffin to hold and she did take two more bites of the food before she states she was done.      Patient assisted back to bed and positioned for comfort. Resting with eyes closed at this time.

## 2017-12-17 NOTE — Progress Notes (Signed)
Patient seen to assess for geriatric related needs. Patient was unable to sustain attention to answer questions. Requiring complete help with eating, drinking and ambulating to bathroom. Anxious and restless with continual hand wringing and movement. Preparing to move to hospital room. Will f/u in am
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
## 2017-12-17 NOTE — ED Notes (Signed)
Pt sleeping with no signs of distress.

## 2017-12-17 NOTE — ED Notes (Signed)
Pt incontinent of urine. Incontinence care provided and brief applied. Linen changed. Pt settled for sleep.

## 2017-12-17 NOTE — ED Notes (Signed)
Pt restless in bed. Repositioned, reassurance offered and settled for sleep

## 2017-12-17 NOTE — ED Notes (Signed)
SBAR report given to [place], Charity fundraiser.  Patient will be transported to Room 5103 via stretcher.

## 2017-12-17 NOTE — ED Notes (Signed)
Sleeping.

## 2017-12-17 NOTE — ED Notes (Signed)
Patient out of bed and removing her gown and brief.  Patient with unintelligible speech but able to state her name.  Asked patient if she needed to use the restroom and she replied yes.      Ambulated to restroom with one person assist for safety, unsteady gait but stable with assistance for balance.  Patient returned to room and positioned comfortably in bed.

## 2017-12-17 NOTE — ED Notes (Signed)
Patient assisted to restroom, stated she did not need to use restroom, brief was slightly wet.  Assisted back to room, new brief applied.  Patient positioned in bed for comfort.  Offered drink and patient stated "pepsi", attempted to give patient some cola but she did not drink.  Resting with eyes closed at this time.
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
## 2017-12-17 NOTE — Progress Notes (Signed)
Initiated Telepsyce, machine unavailable

## 2017-12-18 LAB — CBC WITH AUTOMATED DIFF
BASOPHILS: 0.5 % (ref 0–3)
EOSINOPHILS: 1.9 % (ref 0–5)
HCT: 43.5 % (ref 37.0–50.0)
HGB: 14.5 gm/dl (ref 13.0–17.2)
IMMATURE GRANULOCYTES: 0.4 % (ref 0.0–3.0)
LYMPHOCYTES: 28.9 % (ref 28–48)
MCH: 30 pg (ref 25.4–34.6)
MCHC: 33.3 gm/dl (ref 30.0–36.0)
MCV: 89.9 fL (ref 80.0–98.0)
MONOCYTES: 13.3 % — ABNORMAL HIGH (ref 1–13)
MPV: 10.3 fL — ABNORMAL HIGH (ref 6.0–10.0)
NEUTROPHILS: 55 % (ref 34–64)
NRBC: 0 (ref 0–0)
PLATELET: 310 10*3/uL (ref 140–450)
RBC: 4.84 M/uL (ref 3.60–5.20)
RDW-SD: 42.9 (ref 36.4–46.3)
WBC: 8.6 10*3/uL (ref 4.0–11.0)

## 2017-12-18 LAB — METABOLIC PANEL, BASIC
Anion gap: 8 mmol/L (ref 5–15)
BUN: 11 mg/dl (ref 7–25)
CO2: 25 mEq/L (ref 21–32)
Calcium: 9.2 mg/dl (ref 8.5–10.1)
Chloride: 106 mEq/L (ref 98–107)
Creatinine: 0.7 mg/dl (ref 0.6–1.3)
GFR est AA: >60
GFR est non-AA: >60
Glucose: 104 mg/dl (ref 74–106)
Potassium: 3.6 mEq/L (ref 3.5–5.1)
Sodium: 139 mEq/L (ref 136–145)

## 2017-12-18 LAB — MAGNESIUM: Magnesium: 2.3 mg/dl (ref 1.6–2.6)

## 2017-12-18 MED ORDER — DONEPEZIL 10 MG TAB
10 mg | Freq: Every evening | ORAL | Status: DC
Start: 2017-12-18 — End: 2017-12-26
  Administered 2017-12-19 – 2017-12-26 (×5): via ORAL

## 2017-12-18 MED ORDER — CITALOPRAM 20 MG TAB
20 mg | Freq: Every day | ORAL | Status: DC
Start: 2017-12-18 — End: 2017-12-19
  Administered 2017-12-18 – 2017-12-19 (×2): via ORAL

## 2017-12-18 MED ORDER — TOLTERODINE SR 2 MG 24 HR CAP
2 mg | Freq: Every day | ORAL | Status: DC
Start: 2017-12-18 — End: 2017-12-26
  Administered 2017-12-18 – 2017-12-24 (×5): via ORAL

## 2017-12-18 MED ORDER — MEMANTINE 5 MG TAB
5 mg | Freq: Two times a day (BID) | ORAL | Status: DC
Start: 2017-12-18 — End: 2017-12-26
  Administered 2017-12-18 – 2017-12-26 (×10): via ORAL

## 2017-12-18 MED ORDER — LORAZEPAM 0.5 MG TAB
0.5 mg | Freq: Once | ORAL | Status: DC
Start: 2017-12-18 — End: 2017-12-17
  Administered 2017-12-18: 01:00:00 via ORAL

## 2017-12-18 MED ORDER — LEVOTHYROXINE 25 MCG TAB
25 mcg | Freq: Every day | ORAL | Status: DC
Start: 2017-12-18 — End: 2017-12-26
  Administered 2017-12-19 – 2017-12-26 (×8): via ORAL

## 2017-12-18 MED ORDER — LORAZEPAM 2 MG/ML IJ SOLN
2 mg/mL | Freq: Once | INTRAMUSCULAR | Status: AC
Start: 2017-12-18 — End: 2017-12-17
  Administered 2017-12-18: 03:00:00 via INTRAVENOUS

## 2017-12-18 MED ORDER — LORAZEPAM 2 MG/ML IJ SOLN
2 mg/mL | Freq: Once | INTRAMUSCULAR | Status: AC
Start: 2017-12-18 — End: 2017-12-18
  Administered 2017-12-18: 07:00:00 via INTRAVENOUS

## 2017-12-18 MED FILL — HEPARIN (PORCINE) 5,000 UNIT/ML IJ SOLN: 5000 unit/mL | INTRAMUSCULAR | Qty: 1

## 2017-12-18 MED FILL — BD POSIFLUSH NORMAL SALINE 0.9 % INJECTION SYRINGE: INTRAMUSCULAR | Qty: 20

## 2017-12-18 MED FILL — BD POSIFLUSH NORMAL SALINE 0.9 % INJECTION SYRINGE: INTRAMUSCULAR | Qty: 10

## 2017-12-18 MED FILL — LORAZEPAM 2 MG/ML IJ SOLN: 2 mg/mL | INTRAMUSCULAR | Qty: 1

## 2017-12-18 MED FILL — MEMANTINE 5 MG TAB: 5 mg | ORAL | Qty: 2

## 2017-12-18 MED FILL — TOLTERODINE SR 2 MG 24 HR CAP: 2 mg | ORAL | Qty: 1

## 2017-12-18 MED FILL — CITALOPRAM 20 MG TAB: 20 mg | ORAL | Qty: 1

## 2017-12-18 NOTE — Progress Notes (Signed)
Checked in on the pt. No family present. Pt was fidgeting. Chaplain said a prayer. She didn't open her eyes while chaplain was present.

## 2017-12-18 NOTE — Progress Notes (Signed)
TC to daughter, Angel Reed  [phone].  Angel Reed is extremely upset that "we don't have a plan for her mother".  "I am not able to take care of my mother"  "I don't have the tools to care for my mom" "I am not legally responsible to take my mom back"  "She won't be coming back to my home".  Daughter is very confrontational.  When offered resources for LTC placement "I want it (LTC resources) in writing"  "I am not coming to the hospital for anything"  SW offered to email her the resources but daughter refused.  "Just tell me the best facilities"  SW stated that she could not recommend a specific facility but would be happy to give local [facility] list to begin with.  Daughter was very difficult to redirect.  "I don't understand why she can't go to rehab for 100 days paid for by insurance.  SW explained the difference, in detail, regarding observation status and inpatient.  "You have the tools to make it happen"  Daughter wants to know why her mother isn't inpatient status since she was admitted.  "What about the knot on her head?"  "Why didn't the ED doctor communicate with her hospitalist?"  "Where is the communication there?"  SW was able to establish some type of plan:    1. Page Dr. Franchot Reed and request he call the daughter to answer medical questions.  Why isnt she inpatient?  What happened with the CT scan of her head?  What medications is she on?  What is the doctors plan?  2.  Gave a list of the [facility] for daughter to call and begin working on possible LTC placement  3.  Gave daughter [employer] number to assist with planning  4.  Explained to daughter about the guardianship process of [facility]  if she doesn't make plans for her mother.  "I am not agreeing to that if I don't know the process"  SW explained that if she doesn't take responsibility for planning for her mother, we may have no other choice.    SW paged Dr,. Angel Reed requesting he contact daughter regarding medical questions.

## 2017-12-18 NOTE — Progress Notes (Signed)
NUTRITION RECOMMENDATIONS:   Given hx dementia and per hx from daughter, rec SLP eval prior to continuing with regular diet   Otherwise, rec regular diet (no therapeutic diet restrictions, that is), plus Ensure Compact BID (220 kcals, 9 gm protein each)   Assist with meals/feeding as needed     NUTRITION INITIAL EVALUATION    NUTRITION ASSESSMENT:       Reason for assessment: MST    Admitting diagnosis: Senile dementia with acute confusional state, with behavioral disturbance [F03.91, F05]     PMH:   Past Medical History:   Diagnosis Date   ??? Agitation    ??? Dementia    ??? UTI (urinary tract infection)        Code Status: Full Code      Anthropometrics:  Height:   Ht Readings from Last 3 Encounters:   12/16/17  (1.626 m)   12/09/17  (1.6 m)       Weight:   Wt Readings from Last 3 Encounters:   12/16/17 90.7 kg (200 lb)   12/09/17 90.7 kg (200 lb)       Weight source:          Bed         Standing scale         Pt stated         Estimated         Unknown    ?? IBW: 54.5 kg      ?? % IBW: 166%     ?? BMI: Body mass index is 34.33 kg/m??.    ?? UBW: ~200# per daughter    ?? Wt change: Daughter states wt has been stable           significant        not significant          intended          not intended Diet and intake history:  ?? Current diet order: DIET CARDIAC Regular  ?? DIET NUTRITIONAL SUPPLEMENTS Lunch, Dinner; [employer]    ?? Food allergies: none     ?? Diet/intake history: Per daughter, pt moved in with her on April 1st, 2019. Dtr cooks 3 meals for her - breakfast - likes scrambled eggs. Lunch - 1/2 sandwich. Dinner - chicken or fish, small portion vegetables. Likes baked apples. Drinks water or Gatorade.             <50% intake x >5 days         <50% intake x >1 month         <75% intake x >7 days          <75% intake x 1 month          <75% intake x 3 months    ?? Current appetite/PO intake:            N/A- NPO           Very poor (<25% of meals)            Poor (<50% of meals)           Fair (50-75% of meals)            Good (>75% of meals)    ?? Assessment of current MNT: Reg diet (no restrictions ) appropriate, rec SLP Eval if needed to determine appropriate diet texture. Diet adequate to meet nutrition needs if po >75% with meals. Added Ensure Compact to  increase calorie/pro intake opportunity     ?? Cultural, religious, and ethnic food preferences identified: none     Physical Assessment:  ?? GI symptoms: abd soft, active BS; LBM PTA     ?? Chewing/swallowing issues: ? Dysphagia - daughter reports hx of "gulping hard," 2/2 throat pain, unsure if pt at risk - would rec SLP eval if needed. Denies chewing issues.     ?? Skin integrity: intact     ?? Muscle wasting:    unable to assess at this time    none   mild: temporal   moderate:   severe:    ?? Fat wasting:   unable to assess at this time    none   mild:   moderate:   severe:    ?? Fluid accumulation: none    ?? Mental status: alert, hx dementia, confusion and agitation     ?? Functional status: partial assistance, needs feeding assistance  Intake and output:    Intake/Output Summary (Last 24 hours) at 12/18/2017 1005  Last data filed at 12/18/2017 0945  Gross per 24 hour   Intake 160 ml   Output ???   Net 160 ml     Estimated daily nutrition intake needs:  ?? 1200-1400 kcals (22-25 kcals/kg IBW)    ?? 80 - 100 g protein (1.5-1.8 gm/kg IBW)    ?? 2250 mL fluid (25 ml/kg ABW)     Living situation:  alone, little/no support     alone, family nearby/supportive       with family/caregiver      in NH/SNF      homeless    Current pertinent medications: heparin     Pertinent labs: 5/16 - pert labs reviewed     Does patient meet ASPEN/AND criteria for malnutrition diagnosis: not at this time     NUTRITION DIAGNOSIS:     1. Inadequate oral intake related to hx dementia, p/w agitation as evidenced by medical hx, po estimated to be <75% x >3 months (chronic).     2. Swallowing difficulty (possible) related to dementia as evidenced by daughter reports hx of possible difficulty swallowing, throat pain    NUTRITION INTERVENTION:   Recommended diet: Given hx dementia and per hx from daughter, rec SLP eval prior to continuing with regular diet   Otherwise, rec regular diet (no therapeutic diet restrictions, that is), plus Ensure Compact BID (220 kcals, 9 gm protein each)   Assist with meals/feeding as needed     NUTRITION MONITORING AND EVALUATION:     Nutrition level of care:   low        moderate       high    Nutrition monitoring: PO intake, diet tolerance and compliance, wt, BS, CMP, hydration, medical changes    Nutrition goals: PO intake >50-75% with meals, wt stable through LOS, nutrition related lab values WNL     Monia Sabal, RD  12/18/17

## 2017-12-18 NOTE — Progress Notes (Signed)
Patient admitted on 12/16/2017 from daughter's home with   Chief Complaint   Patient presents with   ??? Altered mental status     Daughter trying to ge her medicine for dimentia.   ??? Mental Health Problem   ??? Alzheimers        The patient has been admitted to the hospital 0 times in the past 12 months.    Tentative dc plan: LTC vs return home w/daughter    Facility if plan: unknown at this time    Anticipated Discharge Date:  1-2 days    PCP: None     Face sheet information, address, contact info and insurance verified: yes     Pharmacy:  Daughter is unaware of any medications patient is on, no pharm pref    Home Environment:    Lives with daughter at [address]  [place] [address]       Prior to admission open services:  none      Extended Emergency Contact Information  Primary Emergency Contact: Max Sane  Address: [address]           [place], [place] [address] [place]  Home Phone: [phone]  Relation: Daughter      Transportation:  daughter will transport home    Therapy Recommendations: PT    RT Home O2 Evaluation : no    Wound Care: no    Change consult (formerly chamberlain) :   no    Case Management Assessment    ABUSE/NEGLECT SCREENING   Physical Abuse/Neglect: Denies   Sexual Abuse: Denies   Sexual Abuse: Denies   Other Abuse/Issues: Denies          PRIMARY DECISION MAKER    self vs daughter (pending guardianship)                               CARE MANAGEMENT INTERVENTIONS   Readmission Interview Completed: Not Applicable   PCP Verified by CM: Yes(no PCP local - pending d/c plan)                   Transition of Care Consult (CM Consult): Discharge Planning                               Current Support Network: Relative's Home       History Provided By: Medical Record, Child/Family   Patient Orientation: Alert and Oriented, Self   Cognition: Dementia / Early Alzheimer's       Previous Living Arrangement: Lives with Family Dependent            Current Functional Level: Assistance with the following:, Feeding, Toileting, Mobility, Shopping, Cooking, Housework, Insurance claims handler Language: English   Can patient return to prior living arrangement: Unknown at present   Ability to make needs known:: Fair   Family able to assist with home care needs:: Other (see comment)(daughter w/caregiver burnout)           Child psychotherapist Referral: Family concerns/conflicts, Abuse or neglect concerns, Engineer, drilling problem   Types of Needs Identified: ADLs/IADLs, Financial, Safety/Abuse, No PCP, Functions Dependency Needs   Anticipated Discharge Needs: TCC, Other (see comment)(LTC or ALF)               Freedom of Choice Offered: Yes      DISCHARGE LOCATION   Discharge Placement:  [facility]

## 2017-12-18 NOTE — Progress Notes (Signed)
Medical Progress Note      NAME: Angel Reed   DOB:  02-11-1934  MRN:             [id number]    Date/Time: 12/18/2017  11:31 AM      Assessment:     1. Behavioral disturbance   2. Agitation   3. Dementia     Plan:   ?? Patient medically stable   ?? Awaiting discharge planning   ?? Continue one-to-one sitter   ?? Continue current medical management     Subjective:   Confusion and disorientation    Objective:     Vitals:    Last 24hrs VS reviewed since prior progress note. Most recent are:  Visit Vitals  BP (!) 161/97 (BP 1 Location: Right arm)   Pulse 99   Temp 97.5 ??F (36.4 ??C)   Resp 26   Ht  (1.626 m)   Wt 90.7 kg (200 lb)   SpO2 92%   Breastfeeding? No   BMI 34.33 kg/m??     SpO2 Readings from Last 6 Encounters:   12/18/17 92%   12/09/17 96%            Intake/Output Summary (Last 24 hours) at 12/18/2017 1130  Last data filed at 12/18/2017 0945  Gross per 24 hour   Intake 160 ml   Output ???   Net 160 ml        Exam:     General:   Not in acute distress  HEENT: PERRLA, Neck Supple,  No JVD  Respiratory:   CTA bilaterally-no wheezes, rales, rhonchi, or crackles  Cardiac:  Regular Rate and Rythmn  - no murmurs, rubs or gallops  Abdominal:  Soft, non-tender, non-distended, positive bowel sounds  Extremities:  No cyanosis, or edema.  Skin: No rash  Neurological:  No focal neurological deficits    Medication:   Current Medications Reviewed    Current Facility-Administered Medications:   ???  ondansetron (ZOFRAN) injection 4 mg, 4 mg, IntraVENous, Q4H PRN, Gwenyth Allegra, MD  ???  sodium chloride (NS) flush 5-10 mL, 5-10 mL, IntraVENous, Q8H, Jasarevic, Muhamed, MD, 10 mL at 12/18/17 0624  ???  sodium chloride (NS) flush 5-10 mL, 5-10 mL, IntraVENous, PRN, Irving Burton, MD  ???  naloxone (NARCAN) injection 0.1 mg, 0.1 mg, IntraVENous, PRN, Jasarevic, Muhamed, MD  ???  acetaminophen (TYLENOL) tablet 650 mg, 650 mg, Oral, Q6H PRN, Jasarevic, Muhamed, MD   ???  heparin (porcine) injection 5,000 Units, 5,000 Units, SubCUTAneous, Q12H, Irving Burton, MD, 5,000 Units at 12/17/17 2228  ???  nystatin (MYCOSTATIN) 100,000 unit/gram powder, , Topical, BID, Tenny Craw, Meta Hatchet, MD      Lab:     Lab Data Reviewed: (see below)  Recent Results (from the past 24 hour(s))   CBC WITH AUTOMATED DIFF    Collection Time: 12/18/17  6:17 AM   Result Value Ref Range    WBC 8.6 4.0 - 11.0 1000/mm3    RBC 4.84 3.60 - 5.20 M/uL    HGB 14.5 13.0 - 17.2 gm/dl    HCT 45.4 09.8 - 11.9 %    MCV 89.9 80.0 - 98.0 fL    MCH 30.0 25.4 - 34.6 pg    MCHC 33.3 30.0 - 36.0 gm/dl    PLATELET 147 829 - 562 1000/mm3    MPV 10.3 (H) 6.0 - 10.0 fL    RDW-SD 42.9 36.4 - 46.3      NRBC 0 0 - 0  IMMATURE GRANULOCYTES 0.4 0.0 - 3.0 %    NEUTROPHILS 55.0 34 - 64 %    LYMPHOCYTES 28.9 28 - 48 %    MONOCYTES 13.3 (H) 1 - 13 %    EOSINOPHILS 1.9 0 - 5 %    BASOPHILS 0.5 0 - 3 %   MAGNESIUM    Collection Time: 12/18/17  6:17 AM   Result Value Ref Range    Magnesium 2.3 1.6 - 2.6 mg/dl   METABOLIC PANEL, BASIC    Collection Time: 12/18/17  6:17 AM   Result Value Ref Range    Sodium 139 136 - 145 mEq/L    Potassium 3.6 3.5 - 5.1 mEq/L    Chloride 106 98 - 107 mEq/L    CO2 25 21 - 32 mEq/L    Glucose 104 74 - 106 mg/dl    BUN 11 7 - 25 mg/dl    Creatinine 0.7 0.6 - 1.3 mg/dl    GFR est AA >57.8      GFR est non-AA >60      Calcium 9.2 8.5 - 10.1 mg/dl    Anion gap 8 5 - 15 mmol/L       No results found.    Active Problems:    Senile dementia with acute confusional state, with behavioral disturbance (12/16/2017)      ____________________________________________________________________      Attending Physician: Theodis Aguas, MD

## 2017-12-18 NOTE — Other (Signed)
Bedside shift change report given to Lina (oncoming nurse) by Nora (offgoing nurse). Report included the following information SBAR.

## 2017-12-18 NOTE — Other (Addendum)
Pt was extremely restless, digging at her rectum/vaginal area.  Very hard stool noted to be impacted inside of her rectum.  Medium amount removed via digital disimpaction.  Pt appears more comfortable, not as restless.    2256: Pt able to have another large formed BM, now resting comfortably.

## 2017-12-18 NOTE — Progress Notes (Signed)
Problem: Falls - Risk of  Goal: *Absence of Falls  Description  Document Schmid Fall Risk and appropriate interventions in the flowsheet.  Outcome: Not Progressing Towards Goal

## 2017-12-18 NOTE — Progress Notes (Signed)
SW reviewed telepsych and requested assistance from [employer].  SW faxed telepsych to Norm Parcel to review and offer suggestions.  SW will await response

## 2017-12-18 NOTE — Progress Notes (Signed)
Problem: Falls - Risk of  Goal: *Absence of Falls  Description  Document Schmid Fall Risk and appropriate interventions in the flowsheet.  Outcome: Progressing Towards Goal

## 2017-12-18 NOTE — Other (Signed)
Bedside and Verbal shift change report given to Emily A Houle  (oncoming nurse) by Lina (offgoing nurse). Report included the following information SBAR and Kardex.

## 2017-12-18 NOTE — Progress Notes (Signed)
PHYSICAL THERAPY MISSED SESSION        Patient: Angel Reed (84 y.o. female)  Room: 5103/5103    Date: 12/18/2017  Time:  1145  Primary Diagnosis: Senile dementia with acute confusional state, with behavioral disturbance [F03.91, F05]    Physical Therapy order received and chart reviewed. Discussed with nurse Alric Seton and CM Annelise.    OBJECTIVE:     Patient was not seen for skilled physical therapy evaluation, secondary to pt just getting to sleep over last 1-2 hours after being up restless all night despite being given ativan x2.    Comments: Spoke with nurse who requests to let the patient sleep if possible. Spoke with case manager, Danton Sewer, who states ok to hold PT today due to pt on Observation and therefore not currently awaiting placement.  Chart review indicates pt was ambulating independently upon arrival, however, confused and agitated and unsafe to self and family at home.    PLAN:     Our services will follow up as patient's condition and/or schedule permits.    Heidi M.R. Rosana Hoes, PT, DPT, CMTPT

## 2017-12-19 MED ORDER — LORAZEPAM 2 MG/ML IJ SOLN
2 mg/mL | INTRAMUSCULAR | Status: DC
Start: 2017-12-19 — End: 2017-12-19

## 2017-12-19 MED ORDER — LORAZEPAM 2 MG/ML IJ SOLN
2 mg/mL | INTRAMUSCULAR | Status: AC
Start: 2017-12-19 — End: 2017-12-20
  Administered 2017-12-20: 13:00:00 via INTRAVENOUS

## 2017-12-19 MED ORDER — HALOPERIDOL LACTATE 5 MG/ML IJ SOLN
5 mg/mL | Freq: Three times a day (TID) | INTRAMUSCULAR | Status: DC | PRN
Start: 2017-12-19 — End: 2017-12-26
  Administered 2017-12-19 – 2017-12-26 (×6): via INTRAVENOUS

## 2017-12-19 MED ORDER — QUETIAPINE 25 MG TAB
25 mg | Freq: Two times a day (BID) | ORAL | Status: DC
Start: 2017-12-19 — End: 2017-12-26
  Administered 2017-12-20 – 2017-12-26 (×7): via ORAL

## 2017-12-19 MED FILL — DONEPEZIL 10 MG TAB: 10 mg | ORAL | Qty: 1

## 2017-12-19 MED FILL — TOLTERODINE SR 2 MG 24 HR CAP: 2 mg | ORAL | Qty: 1

## 2017-12-19 MED FILL — MEMANTINE 5 MG TAB: 5 mg | ORAL | Qty: 2

## 2017-12-19 MED FILL — LEVOTHYROXINE 25 MCG TAB: 25 mcg | ORAL | Qty: 1

## 2017-12-19 MED FILL — CITALOPRAM 20 MG TAB: 20 mg | ORAL | Qty: 1

## 2017-12-19 MED FILL — HEPARIN (PORCINE) 5,000 UNIT/ML IJ SOLN: 5000 unit/mL | INTRAMUSCULAR | Qty: 1

## 2017-12-19 MED FILL — ACETAMINOPHEN 325 MG TABLET: 325 mg | ORAL | Qty: 2

## 2017-12-19 MED FILL — HALOPERIDOL LACTATE 5 MG/ML IJ SOLN: 5 mg/mL | INTRAMUSCULAR | Qty: 1

## 2017-12-19 NOTE — Progress Notes (Signed)
Left vm to Cynhia Devers re MRI checklist

## 2017-12-19 NOTE — Other (Signed)
Spoke to daughter  raizel wesolowski as listed in the demographics page at phone number [phone]  Made aware of the room change from 5103 to 5117 .

## 2017-12-19 NOTE — Progress Notes (Addendum)
PAGER ID: [phone]   MESSAGE: [phone] [place]- I tried to add seroquel 12.5 BID per psyche recommendation, it's giving me a warning that it can cause arrythmias with aricept. Do u wanna override?? Cyndy [phone]    -Dr Tenny Craw said override all warnings

## 2017-12-19 NOTE — Progress Notes (Signed)
Medical Progress Note      NAME: Angel Reed   DOB:  1933/09/28  MRN:             [id number]    Date/Time: 12/19/2017  11:31 AM      Assessment:     1. Behavioral disturbance   2. Agitation   3. Dementia   4. Recent UTI  5. Fall    Plan:   ?? MRI of brain, EEG  ?? Repeat UA on admission is negative  ?? Continue current management  ?? D/w her daughter  ?? Continue one-to-one sitter     Subjective:   Confusion and disorientation    Objective:     Vitals:    Last 24hrs VS reviewed since prior progress note. Most recent are:  Visit Vitals  BP (!) 132/98 (BP Patient Position: Supine)   Pulse 98   Temp 97.9 ??F (36.6 ??C)   Resp 22   Ht  (1.626 m)   Wt 90.7 kg (200 lb)   SpO2 95%   Breastfeeding? No   BMI 34.33 kg/m??     SpO2 Readings from Last 6 Encounters:   12/19/17 95%   12/09/17 96%            Intake/Output Summary (Last 24 hours) at 12/19/2017 1544  Last data filed at 12/19/2017 0924  Gross per 24 hour   Intake 520 ml   Output ???   Net 520 ml        Exam:     General:   Not in acute distress  HEENT: PERRLA, Neck Supple,  No JVD  Respiratory:   CTA bilaterally-no wheezes, rales, rhonchi, or crackles  Cardiac:  Regular Rate and Rythmn  - no murmurs, rubs or gallops  Abdominal:  Soft, non-tender, non-distended, positive bowel sounds  Extremities:  No cyanosis, or edema.  Skin: No rash  Neurological:  No focal neurological deficits    Medication:   Current Medications Reviewed    Current Facility-Administered Medications:   ???  citalopram (CELEXA) tablet 10 mg, 10 mg, Oral, DAILY, Elizabeth Paulsen, Meta Hatchet, MD, 10 mg at 12/19/17 0900  ???  donepezil (ARICEPT) tablet 10 mg, 10 mg, Oral, QHS, Olamide Carattini, Meta Hatchet, MD, 10 mg at 12/18/17 2115  ???  levothyroxine (SYNTHROID) tablet 75 mcg, 75 mcg, Oral, ACB, Devann Cribb, Meta Hatchet, MD, 75 mcg at 12/19/17 4540  ???  memantine (NAMENDA) tablet 10 mg, 10 mg, Oral, BID, Theodis Aguas, MD, 10 mg at 12/19/17 0900  ???  tolterodine ER (DETROL-LA) capsule 2 mg, 2 mg, Oral, DAILY, Rachit Grim,  Meta Hatchet, MD, 2 mg at 12/19/17 0900  ???  ondansetron (ZOFRAN) injection 4 mg, 4 mg, IntraVENous, Q4H PRN, Gwenyth Allegra, MD  ???  sodium chloride (NS) flush 5-10 mL, 5-10 mL, IntraVENous, Q8H, Jasarevic, Muhamed, MD, 10 mL at 12/18/17 2115  ???  sodium chloride (NS) flush 5-10 mL, 5-10 mL, IntraVENous, PRN, Irving Burton, MD  ???  naloxone (NARCAN) injection 0.1 mg, 0.1 mg, IntraVENous, PRN, Jasarevic, Muhamed, MD  ???  acetaminophen (TYLENOL) tablet 650 mg, 650 mg, Oral, Q6H PRN, Earlie Lou, Muhamed, MD, 650 mg at 12/18/17 2114  ???  heparin (porcine) injection 5,000 Units, 5,000 Units, SubCUTAneous, Q12H, Jasarevic, Muhamed, MD, 5,000 Units at 12/19/17 0900  ???  nystatin (MYCOSTATIN) 100,000 unit/gram powder, , Topical, BID, Ashden Sonnenberg, Meta Hatchet, MD      Lab:     Lab Data Reviewed: (see below)  No results found for this or  any previous visit (from the past 24 hour(s)).    No results found.    Active Problems:    Senile dementia with acute confusional state, with behavioral disturbance (12/16/2017)      ____________________________________________________________________      Attending Physician: Theodis Aguas, MD

## 2017-12-19 NOTE — Other (Signed)
Bedside shift change report given to Cyndy Clare F Domingo, RN   (oncoming nurse) by Emily RN (offgoing nurse). Report included the following information SBAR.

## 2017-12-19 NOTE — Progress Notes (Addendum)
This cm asked to speak to daughter Angel Reed by patient experience Angel Reed.   Spoke with Angel Reed at great length about plans, her concerns, etc. She wants answers to questions that she has never addressed with doctors or nurses regarding medical plan. Advised that this cm would ask md to call her as soon as possible   Reinforced that dau has to get access to finance so we can start medicaid app or we cannot help.   Reinforced notice of non payment.   Addressed all of her concerns and explained medicare and medicaid guidelines again.   Angel Reed asks for family meeting. She would like dr Tenny Craw, patient advocate and myself.   She asks for a call from dr terefe (shouldn't need to be there Monday if this is accomplished)     Will bring list of resources.     Meeting is at 1100 mon, email sent to all involved     Additional information pertinent to this case.   In conversation with Angel Reed, she referred to her mother as a "monster" and tells this cm she will not and does not have to be responsible for her mother. , she tells this cm that she has no idea how much money mom has because she "doesn't have a crystal ball"   She shares with this cm that the patients son, her brother, has emptied out moms banks account and has paid his personal bills,  taken cruises   , etc out of moms money. When this cm asks Angel Reed how she knows this , she tells this cm she has access to her moms account, as she has her online banking info ( sign on and pw) this contradicts the previous statement that she didn't have a crystal ball.   She also stated that she works and [employer].   She stated that brother in [place] would lock mother in basement  Discharging this patient home in her daughters care, in this cm opinion would not be the patients best interest . If indeed , the daughter does take home, aps will be notified.   Will speak to team prior to meeting on Monday.   This cm suggests that hospital possibly seek guardianship.    Advised daughter to seek advice from elder attorney. She states she did but "they never call me back" she states her legal resource benefit covers guardian and conservator ship  Will follow with team prior to meeting.     Update Monday am 1000  This cm  spoke to daughter, Angel Reed , at great length. She totally agrees she is not safe with her at home. there is no plan to take her home, she cannot provide 24/7 care.   She was very forthcoming with financial info and plans she and her siblings have devised over the weekend to provide long term care, they are in agreement to sell her house and call the office of federal affairs (?) whom she states would have any information about her retirement , 59 , etc if they were provided from her employment with the federal gov???t  Spoke with Angel Reed about the financial piece of this hospitalization (notice of medicare non payment)  and our commitment to care for mom until there is a safe plan. Angel Reed is aware that they might have a hefty hospital bill and they are not against paying it once they sell the house .      CM  will continue to secure a safe dc plan  She fully understands that the patients estate may be financially responsible for the bill.   Angel Reed states that she will send this cm , via email,  all the siblings contact info and that they are all in agreement that without a poa, Angel Reed will be the collective spokesperson. Along with this email she will attached    This cm will call siblings, once email arrives from Angel Reed.   This cm has asked for the assistance from   Angel Reed, daughter to:  1) call department of social services to start the Medicaid process spenddown  2) send all info that she has to expedite her guardianship process  3) secure all financial documents she has access to  4) provide this cm with siblings contact info  this cm has asked sw, pam cole to   5) contact aps and ask for guidance on the situation  6) call [facility] for assistance in placement   7) assist with finding placement in community with 24/7 care. All info is put in [facility] by this cm.   will continue to follow for safe dc plan.   Will speak with all siblings as soon as possible.

## 2017-12-20 ENCOUNTER — Observation Stay: Payer: MEDICARE

## 2017-12-20 MED FILL — DONEPEZIL 10 MG TAB: 10 mg | ORAL | Qty: 1

## 2017-12-20 MED FILL — QUETIAPINE 25 MG TAB: 25 mg | ORAL | Qty: 1

## 2017-12-20 MED FILL — MEMANTINE 5 MG TAB: 5 mg | ORAL | Qty: 2

## 2017-12-20 MED FILL — HEPARIN (PORCINE) 5,000 UNIT/ML IJ SOLN: 5000 unit/mL | INTRAMUSCULAR | Qty: 1

## 2017-12-20 MED FILL — HALOPERIDOL LACTATE 5 MG/ML IJ SOLN: 5 mg/mL | INTRAMUSCULAR | Qty: 1

## 2017-12-20 MED FILL — TOLTERODINE SR 2 MG 24 HR CAP: 2 mg | ORAL | Qty: 1

## 2017-12-20 MED FILL — LEVOTHYROXINE 25 MCG TAB: 25 mcg | ORAL | Qty: 1

## 2017-12-20 MED FILL — LORAZEPAM 2 MG/ML IJ SOLN: 2 mg/mL | INTRAMUSCULAR | Qty: 1

## 2017-12-20 MED FILL — BD POSIFLUSH NORMAL SALINE 0.9 % INJECTION SYRINGE: INTRAMUSCULAR | Qty: 10

## 2017-12-20 NOTE — Progress Notes (Signed)
Medical Progress Note      NAME: Angel Reed   DOB:  [birth year]  MRN:             [id number]    Date/Time: 12/20/2017  4:00 PM      Assessment:     1. Behavioral disturbance   2. Agitation   3. Dementia   4. Recent UTI  5. Fall    Plan:   ?? MRI of brain is pending  ?? EEG also pending  ?? Mental status is unchanged  ?? Telemetry psych has recommended Seroquel and that has been started last night  ?? I have discussed with her daughter again today and updated her status she is appreciated above for all the help her mom is getting here.  ?? Continue monitor  ?? Discharge planning per care manager    Subjective:   Confusion and agitation    Objective:     Vitals:    Last 24hrs VS reviewed since prior progress note. Most recent are:  Visit Vitals  BP 112/78 (BP 1 Location: Left arm, BP Patient Position: Supine)   Pulse 86   Temp 97.3 ??F (36.3 ??C)   Resp 16   Ht  (1.626 m)   Wt 90.7 kg (200 lb)   SpO2 99%   Breastfeeding? No   BMI 34.33 kg/m??     SpO2 Readings from Last 6 Encounters:   12/20/17 99%   12/09/17 96%            Intake/Output Summary (Last 24 hours) at 12/20/2017 1600  Last data filed at 12/19/2017 1840  Gross per 24 hour   Intake 240 ml   Output ???   Net 240 ml        Exam:     General:   Not in acute distress  HEENT: PERRLA, Neck Supple,  No JVD  Respiratory:   CTA bilaterally-no wheezes, rales, rhonchi, or crackles  Cardiac:  Regular Rate and Rythmn  - no murmurs, rubs or gallops  Abdominal:  Soft, non-tender, non-distended, positive bowel sounds  Extremities:  No cyanosis, or edema.  Skin: No rash  Neurological:  No focal neurological deficits    Medication:   Current Medications Reviewed    Current Facility-Administered Medications:   ???  QUEtiapine (SEROquel) tablet 12.5 mg, 12.5 mg, Oral, BID, Eleora Sutherland, Meta Hatchet, MD, 12.5 mg at 12/20/17 1029  ???  haloperidol lactate (HALDOL) injection 1 mg, 1 mg, IntraVENous, Q8H PRN, Theodis Aguas, MD, 1 mg at 12/20/17 1029   ???  donepezil (ARICEPT) tablet 10 mg, 10 mg, Oral, QHS, Chon Buhl, Meta Hatchet, MD, 10 mg at 12/19/17 2123  ???  levothyroxine (SYNTHROID) tablet 75 mcg, 75 mcg, Oral, ACB, Stevin Bielinski, Meta Hatchet, MD, 75 mcg at 12/20/17 0641  ???  memantine (NAMENDA) tablet 10 mg, 10 mg, Oral, BID, Theodis Aguas, MD, 10 mg at 12/20/17 1029  ???  tolterodine ER (DETROL-LA) capsule 2 mg, 2 mg, Oral, DAILY, Maeson Purohit, Meta Hatchet, MD, 2 mg at 12/20/17 1029  ???  ondansetron (ZOFRAN) injection 4 mg, 4 mg, IntraVENous, Q4H PRN, Gwenyth Allegra, MD  ???  sodium chloride (NS) flush 5-10 mL, 5-10 mL, IntraVENous, Q8H, Jasarevic, Muhamed, MD, Stopped at 12/20/17 0600  ???  sodium chloride (NS) flush 5-10 mL, 5-10 mL, IntraVENous, PRN, Irving Burton, MD  ???  naloxone (NARCAN) injection 0.1 mg, 0.1 mg, IntraVENous, PRN, Jasarevic, Muhamed, MD  ???  acetaminophen (TYLENOL) tablet 650 mg, 650 mg, Oral, Q6H  PRN, Irving Burton, MD, 650 mg at 12/18/17 2114  ???  heparin (porcine) injection 5,000 Units, 5,000 Units, SubCUTAneous, Q12H, Jasarevic, Muhamed, MD, 5,000 Units at 12/20/17 1029  ???  nystatin (MYCOSTATIN) 100,000 unit/gram powder, , Topical, BID, Gretna Bergin, Meta Hatchet, MD      Lab:     Lab Data Reviewed: (see below)  No results found for this or any previous visit (from the past 24 hour(s)).    No results found.    Active Problems:    Senile dementia with acute confusional state, with behavioral disturbance (12/16/2017)      ____________________________________________________________________      Attending Physician: Theodis Aguas, MD
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
## 2017-12-20 NOTE — Other (Signed)
Bedside shift change report given to Arnel RN (oncoming nurse) by Phyllis RN (offgoing nurse). Report included the following information SBAR and Kardex.

## 2017-12-21 ENCOUNTER — Observation Stay: Admit: 2017-12-21 | Payer: MEDICARE

## 2017-12-21 MED ORDER — LORAZEPAM 2 MG/ML IJ SOLN
2 mg/mL | INTRAMUSCULAR | Status: AC
Start: 2017-12-21 — End: 2017-12-21
  Administered 2017-12-21: 14:00:00 via INTRAVENOUS

## 2017-12-21 MED FILL — BD POSIFLUSH NORMAL SALINE 0.9 % INJECTION SYRINGE: INTRAMUSCULAR | Qty: 20

## 2017-12-21 MED FILL — MEMANTINE 5 MG TAB: 5 mg | ORAL | Qty: 2

## 2017-12-21 MED FILL — QUETIAPINE 25 MG TAB: 25 mg | ORAL | Qty: 1

## 2017-12-21 MED FILL — HEPARIN (PORCINE) 5,000 UNIT/ML IJ SOLN: 5000 unit/mL | INTRAMUSCULAR | Qty: 1

## 2017-12-21 MED FILL — TOLTERODINE SR 2 MG 24 HR CAP: 2 mg | ORAL | Qty: 1

## 2017-12-21 MED FILL — LORAZEPAM 2 MG/ML IJ SOLN: 2 mg/mL | INTRAMUSCULAR | Qty: 1

## 2017-12-21 MED FILL — LEVOTHYROXINE 25 MCG TAB: 25 mcg | ORAL | Qty: 1

## 2017-12-21 MED FILL — DONEPEZIL 10 MG TAB: 10 mg | ORAL | Qty: 1

## 2017-12-21 NOTE — Other (Signed)
Bedside shift change report given to Arnel RN (oncoming nurse) by Nora RN (offgoing nurse). Report included the following information SBAR and Kardex.

## 2017-12-21 NOTE — Progress Notes (Signed)
Medical Progress Note      NAME: Angel Reed   DOB:  Aug 10, 1933  MRN:             [id number]    Date/Time: 12/21/2017  2:49 PM      Assessment:     1. Behavioral disturbance   2. Agitation   3. Dementia   4. Recent UTI  5. Fall    Plan:   ?? MRI of brain is completed and is unremarkable  ?? EEG also pending  ?? No change in her mental status   ?? Medically patient stable   ?? Continue current management   ?? I have discussed with her daughter yesterday and also today I updated her with the results of the MRI and answered all her questions  ?? Discharge planning per care manager    Subjective:   Confusion and agitation    Objective:     Vitals:    Last 24hrs VS reviewed since prior progress note. Most recent are:  Visit Vitals  BP 159/62 (BP Patient Position: Supine)   Pulse 70   Temp 98.3 ??F (36.8 ??C)   Resp 16   Ht  (1.626 m)   Wt 90.7 kg (200 lb)   SpO2 91%   Breastfeeding? No   BMI 34.33 kg/m??     SpO2 Readings from Last 6 Encounters:   12/21/17 91%   12/09/17 96%            Intake/Output Summary (Last 24 hours) at 12/21/2017 1449  Last data filed at 12/20/2017 2308  Gross per 24 hour   Intake 10 ml   Output ???   Net 10 ml        Exam:     General:   Not in acute distress  HEENT: PERRLA, Neck Supple,  No JVD  Respiratory:   CTA bilaterally-no wheezes, rales, rhonchi, or crackles  Cardiac:  Regular Rate and Rythmn  - no murmurs, rubs or gallops  Abdominal:  Soft, non-tender, non-distended, positive bowel sounds  Extremities:  No cyanosis, or edema.  Skin: No rash  Neurological:  No focal neurological deficits    Medication:   Current Medications Reviewed    Current Facility-Administered Medications:   ???  QUEtiapine (SEROquel) tablet 12.5 mg, 12.5 mg, Oral, BID, Theodis Aguas, MD, 12.5 mg at 12/20/17 2137  ???  haloperidol lactate (HALDOL) injection 1 mg, 1 mg, IntraVENous, Q8H PRN, Theodis Aguas, MD, 1 mg at 12/20/17 1845  ???  donepezil (ARICEPT) tablet 10 mg, 10 mg, Oral, QHS, Lilburn Straw, Meta Hatchet,  MD, 10 mg at 12/20/17 2137  ???  levothyroxine (SYNTHROID) tablet 75 mcg, 75 mcg, Oral, ACB, Loghan Subia, Meta Hatchet, MD, 75 mcg at 12/21/17 0700  ???  memantine (NAMENDA) tablet 10 mg, 10 mg, Oral, BID, Theodis Aguas, MD, 10 mg at 12/20/17 2137  ???  tolterodine ER (DETROL-LA) capsule 2 mg, 2 mg, Oral, DAILY, Hatcher Froning, Meta Hatchet, MD, 2 mg at 12/20/17 1029  ???  ondansetron (ZOFRAN) injection 4 mg, 4 mg, IntraVENous, Q4H PRN, Gwenyth Allegra, MD  ???  sodium chloride (NS) flush 5-10 mL, 5-10 mL, IntraVENous, Q8H, Jasarevic, Muhamed, MD, 10 mL at 12/21/17 0600  ???  sodium chloride (NS) flush 5-10 mL, 5-10 mL, IntraVENous, PRN, Irving Burton, MD  ???  naloxone (NARCAN) injection 0.1 mg, 0.1 mg, IntraVENous, PRN, Jasarevic, Muhamed, MD  ???  acetaminophen (TYLENOL) tablet 650 mg, 650 mg, Oral, Q6H PRN, Irving Burton, MD, 650  mg at 12/18/17 2114  ???  heparin (porcine) injection 5,000 Units, 5,000 Units, SubCUTAneous, Q12H, Irving Burton, MD, 5,000 Units at 12/20/17 2141  ???  nystatin (MYCOSTATIN) 100,000 unit/gram powder, , Topical, BID, Wenceslaus Gist, Meta Hatchet, MD      Lab:     Lab Data Reviewed: (see below)  No results found for this or any previous visit (from the past 24 hour(s)).    No results found.    Active Problems:    Senile dementia with acute confusional state, with behavioral disturbance (12/16/2017)      ____________________________________________________________________      Attending Physician: Theodis Aguas, MD

## 2017-12-21 NOTE — Other (Signed)
Bedside and Verbal shift change report given to Phyllis Williams, RN (oncoming nurse) by Carmelo Calimlim, RN (offgoing nurse). Report included the following information SBAR and Kardex.

## 2017-12-22 LAB — METABOLIC PANEL, BASIC
Anion gap: 9 mmol/L (ref 5–15)
BUN: 17 mg/dl (ref 7–25)
CO2: 26 mEq/L (ref 21–32)
Calcium: 9.8 mg/dl (ref 8.5–10.1)
Chloride: 104 mEq/L (ref 98–107)
Creatinine: 0.8 mg/dl (ref 0.6–1.3)
GFR est AA: >60
GFR est non-AA: >60
Glucose: 118 mg/dl — ABNORMAL HIGH (ref 74–106)
Potassium: 3.5 mEq/L (ref 3.5–5.1)
Sodium: 138 mEq/L (ref 136–145)

## 2017-12-22 LAB — CBC WITH AUTOMATED DIFF
BASOPHILS: 0.3 % (ref 0–3)
EOSINOPHILS: 0.7 % (ref 0–5)
HCT: 45.6 % (ref 37.0–50.0)
HGB: 14.9 gm/dl (ref 13.0–17.2)
IMMATURE GRANULOCYTES: 0.3 % (ref 0.0–3.0)
LYMPHOCYTES: 10.3 % — ABNORMAL LOW (ref 28–48)
MCH: 29.7 pg (ref 25.4–34.6)
MCHC: 32.7 gm/dl (ref 30.0–36.0)
MCV: 91 fL (ref 80.0–98.0)
MONOCYTES: 11.7 % (ref 1–13)
MPV: 10.1 fL — ABNORMAL HIGH (ref 6.0–10.0)
NEUTROPHILS: 76.7 % — ABNORMAL HIGH (ref 34–64)
NRBC: 0 (ref 0–0)
PLATELET: 302 10*3/uL (ref 140–450)
RBC: 5.01 M/uL (ref 3.60–5.20)
RDW-SD: 44.5 (ref 36.4–46.3)
WBC: 10.7 10*3/uL (ref 4.0–11.0)

## 2017-12-22 LAB — MAGNESIUM: Magnesium: 2.4 mg/dl (ref 1.6–2.6)

## 2017-12-22 MED FILL — MEMANTINE 5 MG TAB: 5 mg | ORAL | Qty: 2

## 2017-12-22 MED FILL — HALOPERIDOL LACTATE 5 MG/ML IJ SOLN: 5 mg/mL | INTRAMUSCULAR | Qty: 1

## 2017-12-22 MED FILL — QUETIAPINE 25 MG TAB: 25 mg | ORAL | Qty: 0.5

## 2017-12-22 MED FILL — HEPARIN (PORCINE) 5,000 UNIT/ML IJ SOLN: 5000 unit/mL | INTRAMUSCULAR | Qty: 1

## 2017-12-22 MED FILL — LEVOTHYROXINE 25 MCG TAB: 25 mcg | ORAL | Qty: 1

## 2017-12-22 MED FILL — DONEPEZIL 10 MG TAB: 10 mg | ORAL | Qty: 1

## 2017-12-22 MED FILL — BD POSIFLUSH NORMAL SALINE 0.9 % INJECTION SYRINGE: INTRAMUSCULAR | Qty: 10

## 2017-12-22 MED FILL — TOLTERODINE SR 2 MG 24 HR CAP: 2 mg | ORAL | Qty: 1

## 2017-12-22 NOTE — Progress Notes (Signed)
Medical Progress Note      NAME: Angel Reed   DOB:  September 01, 1933  MRN:             [id number]    Date/Time: 12/22/2017  12:41 PM      Assessment:     1. Behavioral disturbance   2. Agitation   3. Dementia   4. Recent UTI  5. Fall    Plan:   ?? Medically patient is stable   ?? Discharge planning per care manager   ?? Continue current medical management   ?? I have discussed with her daughter yesterday and updated her   ?? We will continue to monitor    Subjective:   Confusion and agitation    Objective:     Vitals:    Last 24hrs VS reviewed since prior progress note. Most recent are:  Visit Vitals  BP 156/55 (BP 1 Location: Left arm, BP Patient Position: Supine)   Pulse 68   Temp 98.3 ??F (36.8 ??C)   Resp 20   Ht  (1.626 m)   Wt 90.7 kg (200 lb)   SpO2 97%   Breastfeeding? No   BMI 34.33 kg/m??     SpO2 Readings from Last 6 Encounters:   12/22/17 97%   12/09/17 96%            Intake/Output Summary (Last 24 hours) at 12/22/2017 1341  Last data filed at 12/22/2017 0520  Gross per 24 hour   Intake 50 ml   Output ???   Net 50 ml        Exam:     General:   Not in acute distress  HEENT: PERRLA, Neck Supple,  No JVD  Respiratory:   CTA bilaterally-no wheezes, rales, rhonchi, or crackles  Cardiac:  Regular Rate and Rythmn  - no murmurs, rubs or gallops  Abdominal:  Soft, non-tender, non-distended, positive bowel sounds  Extremities:  No cyanosis, or edema.  Skin: No rash  Neurological:  No focal neurological deficits    Medication:   Current Medications Reviewed    Current Facility-Administered Medications:   ???  QUEtiapine (SEROquel) tablet 12.5 mg, 12.5 mg, Oral, BID, Theodis Aguas, MD, Stopped at 12/21/17 2200  ???  haloperidol lactate (HALDOL) injection 1 mg, 1 mg, IntraVENous, Q8H PRN, Theodis Aguas, MD, 1 mg at 12/20/17 1845  ???  donepezil (ARICEPT) tablet 10 mg, 10 mg, Oral, QHS, Theodis Aguas, MD, Stopped at 12/21/17 2200  ???  levothyroxine (SYNTHROID) tablet 75 mcg, 75 mcg, Oral, ACB, Camyra Vaeth,  Meta Hatchet, MD, 75 mcg at 12/22/17 0755  ???  memantine (NAMENDA) tablet 10 mg, 10 mg, Oral, BID, Theodis Aguas, MD, Stopped at 12/21/17 2200  ???  tolterodine ER (DETROL-LA) capsule 2 mg, 2 mg, Oral, DAILY, Tenny Craw, Meta Hatchet, MD, Stopped at 12/22/17 1000  ???  ondansetron (ZOFRAN) injection 4 mg, 4 mg, IntraVENous, Q4H PRN, Gwenyth Allegra, MD  ???  sodium chloride (NS) flush 5-10 mL, 5-10 mL, IntraVENous, Q8H, Jasarevic, Muhamed, MD, 10 mL at 12/22/17 0559  ???  sodium chloride (NS) flush 5-10 mL, 5-10 mL, IntraVENous, PRN, Irving Burton, MD  ???  naloxone (NARCAN) injection 0.1 mg, 0.1 mg, IntraVENous, PRN, Jasarevic, Muhamed, MD  ???  acetaminophen (TYLENOL) tablet 650 mg, 650 mg, Oral, Q6H PRN, Earlie Lou, Muhamed, MD, 650 mg at 12/18/17 2114  ???  heparin (porcine) injection 5,000 Units, 5,000 Units, SubCUTAneous, Q12H, Irving Burton, MD, Stopped at 12/22/17 1000  ???  nystatin (MYCOSTATIN) 100,000 unit/gram powder, , Topical, BID, Theodis Aguas, MD, Stopped at 12/22/17 1000      Lab:     Lab Data Reviewed: (see below)  Recent Results (from the past 24 hour(s))   CBC WITH AUTOMATED DIFF    Collection Time: 12/22/17  6:04 AM   Result Value Ref Range    WBC 10.7 4.0 - 11.0 1000/mm3    RBC 5.01 3.60 - 5.20 M/uL    HGB 14.9 13.0 - 17.2 gm/dl    HCT 16.1 09.6 - 04.5 %    MCV 91.0 80.0 - 98.0 fL    MCH 29.7 25.4 - 34.6 pg    MCHC 32.7 30.0 - 36.0 gm/dl    PLATELET 409 811 - 914 1000/mm3    MPV 10.1 (H) 6.0 - 10.0 fL    RDW-SD 44.5 36.4 - 46.3      NRBC 0 0 - 0      IMMATURE GRANULOCYTES 0.3 0.0 - 3.0 %    NEUTROPHILS 76.7 (H) 34 - 64 %    LYMPHOCYTES 10.3 (L) 28 - 48 %    MONOCYTES 11.7 1 - 13 %    EOSINOPHILS 0.7 0 - 5 %    BASOPHILS 0.3 0 - 3 %   MAGNESIUM    Collection Time: 12/22/17  6:04 AM   Result Value Ref Range    Magnesium 2.4 1.6 - 2.6 mg/dl   METABOLIC PANEL, BASIC    Collection Time: 12/22/17  6:04 AM   Result Value Ref Range    Sodium 138 136 - 145 mEq/L    Potassium 3.5 3.5 - 5.1 mEq/L     Chloride 104 98 - 107 mEq/L    CO2 26 21 - 32 mEq/L    Glucose 118 (H) 74 - 106 mg/dl    BUN 17 7 - 25 mg/dl    Creatinine 0.8 0.6 - 1.3 mg/dl    GFR est AA >78.2      GFR est non-AA >60      Calcium 9.8 8.5 - 10.1 mg/dl    Anion gap 9 5 - 15 mmol/L       No results found.    Active Problems:    Senile dementia with acute confusional state, with behavioral disturbance (12/16/2017)      ____________________________________________________________________      Attending Physician: Theodis Aguas, MD

## 2017-12-22 NOTE — Progress Notes (Signed)
Pt is an 84 y.o. Widowed white female. Pt was sleeping when [facility] attempted to visit. [facility] will f/u.

## 2017-12-22 NOTE — Other (Signed)
Bedside and Verbal shift change report given to Autumn N Morrow (oncoming nurse) by Sheryl Spracklin (offgoing nurse). Report included the following information SBAR, Kardex and MAR.

## 2017-12-22 NOTE — Progress Notes (Signed)
EEG completed. Patient was restless throughout study.    [employer], [place]. EEGT

## 2017-12-22 NOTE — Progress Notes (Signed)
SW notarized capacity evaluation for guardianship.  Original copy placed in record

## 2017-12-22 NOTE — Other (Signed)
Bedside and Verbal shift change report given to Sheryl A Spracklin   (oncoming nurse) by Phyllis, RN (offgoing nurse). Report included the following information SBAR and Kardex.

## 2017-12-22 NOTE — Progress Notes (Signed)
SW spoke with [facility], Kristopher Oppenheim [phone], for the purpose of seeking guidance for the patient and possible placement into a psych facility.  Mr. Angel Reed is requesting a repeat telepsych.  In addition, he is requesting clinicals and face sheet be faxed to him for review.  After he reviews clinical and the new telepsych, he may be able to give [place] more guidance and if a TDO can be obtained.    Email to [facility] requesting another telepsych.  SW will upload all clinicals into [place] [facility] and fax them to [facility]
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
## 2017-12-22 NOTE — Progress Notes (Addendum)
To return to PLOF:  Pt will be S with all bed mobility  Pt with be S with all transfers,  Pt will ambulate x200' with LRAD and S  Pt will be able to negotiate 3 steps with S  Pt will increase B LE strength by 1/2 grade  Pt will be S with HEP  Pt pain will be less than 5/10  physical Therapy EVALUATION    Patient: Angel Reed (84 y.o. female)  Room: 5117/5117    Date: 12/22/2017  Start Time:  1310  End Time:  1330    Primary Diagnosis: Senile dementia with acute confusional state, with behavioral disturbance [F03.91, F05]         Precautions: Falls and Impulsive.  Weight bearing precautions: None      ASSESSMENT :  Based on the objective data described below, the patient presents with  Able to mildy participate with bed mobility and transfer training, pt very sedated  Generalized muscle weakness affecting function   Decreased ADL/Functional Activities  Decreased Transfer Abilities  Decreased Ambulation Ability/Technique  Decreased Balance  Decreased Activity Tolerance.      Patient???s rehabilitation potential is considered to be Good     Recommendations:  Physical Therapy  Discharge Recommendations: SNF, 24 hour supervision for safety, Home with home health PT and TBD, pending progress  Further Equipment Recommendations for Discharge: Rolling walker        PLAN :  Planned Interventions:  Functional mobility training Gait Training Stair training Balance Training Therapeutic exercises Therapeutic activities Neuro muscular re-education AD training Patient/caregiver education      Frequency/Duration: Patient will be followed by physical therapy 3x / Week x4weeks to address goals.                 Orders reviewed, chart reviewed on Angel Reed.    SUBJECTIVE:   Patient: agreed to PT    OBJECTIVE DATA SUMMARY:   Present illness history:   Problem List  Never Reviewed          Codes Class Noted    Senile dementia with acute confusional state, with behavioral disturbance ICD-10-CM: F03.91, F05  ICD-9-CM: 294.21  12/16/2017              Past Medical history:   Past Medical History:   Diagnosis Date   ??? Agitation    ??? Dementia    ??? UTI (urinary tract infection)        Prior Level of Function/Home Situation:   Home environment: poor historian, Unknown.  Prior level of function: unknown  Home equipment: None      Patient found: Bed and avys.    Pain Assessment before PT session: None reported  Pain Location:   Pain Assessment after PT session: None reported  Pain Location:              Yes, patient had pain medications            No, Patient has not had pain medications            Nurse notified    COGNITIVE STATUS:     Mental Status: Oriented to, person, lethargic and confused.  Communication: impaired.  Follows commands: impaired.  General Cognition: impaired sequencing, slow processing, delayed responses and impaired safety.  Hearing: grossly intact.  Vision:  grossly intact.      EXTREMITIES ASSESSMENT:      Tone & Sensation:   Normal    Proprioception:   Intact  Strength:    Grossly 4-/5 throughout via functional testing      Range Of Motion:  WFL B LE, UE held in flexion      Functional mobility and balance status:     Supine to sit -  min. assist  Sit to Stand -  min. assist  Stand to Sit -  min. assist  Pt mildly participatory, eyes closed most of time and able to participate but would begin to fall asleep again    Balance:   Static Sitting Balance -  fair+  Dynamic Sitting Balance -  fair-  Static Standing Balance -  poor  Dynamic Standing Balance -  poor      Ambulation/Gait Training:  Unable due to lethargy    Therapeutic Exercises:    Patient received/participated in 10 minutes of treatment (therapeutic exercises/activities) immediately following evaluation and/or educational instruction during/immediately following PT evaluation.    Time spent with pt thera act x10' for mobility and transfer training  Activity Tolerance:   poor    Final Location:   bed, bed alarm, all needs close and nurse notified      COMMUNICATION/EDUCATION:   Education: Patient and Offered/Refused  Barriers to Learning/Limitations: None    Please refer to care plan and patient education section for further details.    Thank you for this referral.  Darnelle Going, PT, DPT  Pager [phone]

## 2017-12-22 NOTE — Progress Notes (Signed)
Problem: Falls - Risk of  Goal: *Absence of Falls  Description  Document Schmid Fall Risk and appropriate interventions in the flowsheet.  Outcome: Progressing Towards Goal     Problem: Patient Education: Go to Patient Education Activity  Goal: Patient/Family Education  Outcome: Progressing Towards Goal     Problem: Pressure Injury - Risk of  Goal: *Prevention of pressure injury  Description  Document Braden Scale and appropriate interventions in the flowsheet.  Outcome: Progressing Towards Goal     Problem: Patient Education: Go to Patient Education Activity  Goal: Patient/Family Education  Outcome: Progressing Towards Goal

## 2017-12-23 ENCOUNTER — Inpatient Hospital Stay

## 2017-12-23 MED FILL — BD POSIFLUSH NORMAL SALINE 0.9 % INJECTION SYRINGE: INTRAMUSCULAR | Qty: 10

## 2017-12-23 MED FILL — HALOPERIDOL LACTATE 5 MG/ML IJ SOLN: 5 mg/mL | INTRAMUSCULAR | Qty: 1

## 2017-12-23 MED FILL — HEPARIN (PORCINE) 5,000 UNIT/ML IJ SOLN: 5000 unit/mL | INTRAMUSCULAR | Qty: 1

## 2017-12-23 MED FILL — LEVOTHYROXINE 25 MCG TAB: 25 mcg | ORAL | Qty: 1

## 2017-12-23 NOTE — Progress Notes (Signed)
NUTRITION FOLLOW UP    Current diet order: DIET CARDIAC Regular  DIET NUTRITIONAL SUPPLEMENTS Lunch, Dinner; [employer]    Vitamin/mineral supplementation: n/a     Other pertinent meds: synthroid, namenda     Current intake: '[]'  N/A- NPO    '[]'  very poor     '[]'  poor      '[x]'  fair      '[]'  good    Weight:   Last 3 Recorded Weights in this Encounter    12/16/17 2129 12/16/17 2131   Weight: 68 kg (150 lb) 90.7 kg (200 lb)       BMI: Body mass index is 34.33 kg/m??.    Physical assessment:  ?? Chewing/swallowing issues: ? Dysphagia - daughter reports hx of "gulping hard," 2/2 throat pain, unsure if pt at risk - would rec SLP eval if needed. Denies chewing issues.   ?? GI symptoms: abd soft, active BS; LBM 5/17 - formed   ?? Fluid retention: none   ?? Skin integrity: intact    Biochemical data: 5/20 - pert labs reviewed     Assessment of current MNT: Rec liberalize to regular diet - appropriate, adequate to meet nutrition needs if po >75% with meals. Continue Ensure Compact to increase calorie/pro intake opportunity.     Nutrition diagnosis:  1. Inadequate oral intake related to hx dementia, p/w agitation as evidenced by medical hx, po estimated to be <75% x >3 months (chronic) - continues  ??  2. Swallowing difficulty (possible) related to dementia as evidenced by daughter reports hx of possible difficulty swallowing, throat pain (pt appears to be tolerating diet currently)     Nutrition recommendation: Regular diet, Ensure Compact BID (220 kcals, 9 gm protein each), assist patient with meals/feeding as needed   Nutrition monitoring: PO intake, diet tolerance and compliance, wt, BS, CMP, hydration, medical changes  ??  Nutrition goals: PO intake >50-75% with meals, wt stable through LOS, nutrition related lab values WNL     Progress towards nutrition goals: '[]'   Met/Ongoing     '[x]'   Progressing appropriately       '[]'   Progressing slowly      '[]'   Not Progressing    Nutrition level of care:  '[]'  low     '[x]'  moderate     '[]'  high     Code status: Full Code    Murlean Hark, RD  12/23/17

## 2017-12-23 NOTE — Progress Notes (Signed)
Problem: Falls - Risk of  Goal: *Absence of Falls  Description  Document Schmid Fall Risk and appropriate interventions in the flowsheet.  Outcome: Progressing Towards Goal     Problem: Pressure Injury - Risk of  Goal: *Prevention of pressure injury  Description  Document Braden Scale and appropriate interventions in the flowsheet.  Outcome: Progressing Towards Goal

## 2017-12-23 NOTE — Progress Notes (Signed)
Inpatient order obtained for continued complex discharge needs, pending guardianship, ongoing therapy treatments. SW has been consulted for DMAS evaluation and the referral has been sent to [facility] for MCD screening. Per CM Manager, Marsha Hogge, she has been loaded in e-dc and the referral has been sent to area SNFs for admission review.

## 2017-12-23 NOTE — Procedures (Signed)
[facility]  Electroencephalogram  NAME:  Angel Reed, Angel Reed   DATE: 12/22/2017  EEG#: [id number]  DOB: 06/12/1934  MR#    [id number]  ROOM:  5EST  ACCT#  [id number]  SEX: F  REFERRING PHYSICIAN: YARED G TEREFE          EEG DESCRIPTION:  The EEG is performed by applying international 10/20 standard electrode placement system and using Cadwell 16-channel digital EEG recording machine.    CLINICAL HISTORY:  The EEG is performed on an 83-year-old female for evaluation of altered mental status and dementia.    The patient is somnolent and restless throughout the recording of this tracing.  Frequent eye movements, movements and muscle artifacts are seen during the recording of this tracing.  No EEG changes of sleep are seen.  Frequent intermittent 3 to 4 Hz delta slowing is seen in a generalized fashion bilaterally.  No abnormal paroxysmal discharges are seen.  No epileptiform activity is seen.    Hyperventilation is not performed.    Photic stimulation is unremarkable.    IMPRESSION:  This is an abnormal EEG due to intermittent 3 to 4 Hz delta slowing seen in a generalized fashion bilaterally consistent with acute toxic metabolic encephalopathy or chronic degenerative disorder of the nervous system.  No epileptiform activity is seen.      ___________________  Chelsea Pedretti R Dhruva Orndoff MD  Dictated By: .   SB  D:12/23/2017 08:55:14  T: 12/23/2017 10:28:03  [id number]

## 2017-12-23 NOTE — Progress Notes (Signed)
Per Melvin with [facility] he is not going to assist in completing a LTC Medicaid application at this time. After getting off the phone with the patient's daughter, Cynthia Drennon he is unsure if the client will meet the financial criteria for Medicaid. He stated that Ms. Kington was elusive on providing information about her mother's income and the resources. He will provide her with an application for Medicaid and the Medicaid Handbook section discussing the income and resource guidelines for Medicaid.

## 2017-12-23 NOTE — Progress Notes (Signed)
Medical Progress Note      NAME: Angel Reed   DOB:  [birth year]  MRN:             [id number]    Date/Time: 12/23/2017  12:35 PM      Assessment:     1. Dementia  2. Recent UTI  3. Fall    Plan:   ?? Patient has no agitation or any aggressive behavior  ?? Patient has confusion secondary to her dementia  ?? Medically patient is stable  ?? Discharge plan is being discussed with care manager and possible to skilled nursing facility  ?? Patient could be discharged anytime when discharge plan is in place    Subjective:   Patient is resting comfortably no issues overnight    Objective:     Vitals:    Last 24hrs VS reviewed since prior progress note. Most recent are:  Visit Vitals  BP 151/62 (BP 1 Location: Left arm, BP Patient Position: Supine)   Pulse 97   Temp 98.9 ??F (37.2 ??C)   Resp 20   Ht  (1.626 m)   Wt 90.7 kg (200 lb)   SpO2 94%   Breastfeeding? No   BMI 34.33 kg/m??     SpO2 Readings from Last 6 Encounters:   12/23/17 94%   12/09/17 96%            Intake/Output Summary (Last 24 hours) at 12/23/2017 1234  Last data filed at 12/23/2017 0928  Gross per 24 hour   Intake 0 ml   Output ???   Net 0 ml        Exam:     General:   Not in acute distress  HEENT: PERRLA, Neck Supple,  No JVD  Respiratory:   CTA bilaterally-no wheezes, rales, rhonchi, or crackles  Cardiac:  Regular Rate and Rythmn  - no murmurs, rubs or gallops  Abdominal:  Soft, non-tender, non-distended, positive bowel sounds  Extremities:  No cyanosis, or edema.  Skin: No rash  Neurological:  No focal neurological deficits    Medication:   Current Medications Reviewed    Current Facility-Administered Medications:   ???  QUEtiapine (SEROquel) tablet 12.5 mg, 12.5 mg, Oral, BID, Theodis Aguas, MD, Stopped at 12/21/17 2200  ???  haloperidol lactate (HALDOL) injection 1 mg, 1 mg, IntraVENous, Q8H PRN, Theodis Aguas, MD, 1 mg at 12/22/17 1623  ???  donepezil (ARICEPT) tablet 10 mg, 10 mg, Oral, QHS, Theodis Aguas, MD, Stopped at 12/21/17 2200   ???  levothyroxine (SYNTHROID) tablet 75 mcg, 75 mcg, Oral, ACB, Kahlen Boyde, Meta Hatchet, MD, Stopped at 12/23/17 0730  ???  memantine (NAMENDA) tablet 10 mg, 10 mg, Oral, BID, Theodis Aguas, MD, Stopped at 12/21/17 2200  ???  tolterodine ER (DETROL-LA) capsule 2 mg, 2 mg, Oral, DAILY, Tenny Craw, Meta Hatchet, MD, Stopped at 12/22/17 1000  ???  ondansetron (ZOFRAN) injection 4 mg, 4 mg, IntraVENous, Q4H PRN, Gwenyth Allegra, MD  ???  sodium chloride (NS) flush 5-10 mL, 5-10 mL, IntraVENous, Q8H, Jasarevic, Muhamed, MD, 10 mL at 12/23/17 0539  ???  sodium chloride (NS) flush 5-10 mL, 5-10 mL, IntraVENous, PRN, Irving Burton, MD  ???  naloxone (NARCAN) injection 0.1 mg, 0.1 mg, IntraVENous, PRN, Jasarevic, Muhamed, MD  ???  acetaminophen (TYLENOL) tablet 650 mg, 650 mg, Oral, Q6H PRN, Earlie Lou, Muhamed, MD, 650 mg at 12/18/17 2114  ???  heparin (porcine) injection 5,000 Units, 5,000 Units, SubCUTAneous, Q12H, Jasarevic, Muhamed, MD, 5,000 Units  at 12/23/17 0915  ???  nystatin (MYCOSTATIN) 100,000 unit/gram powder, , Topical, BID, Tenny Craw, Meta Hatchet, MD, Stopped at 12/23/17 1000      Lab:     Lab Data Reviewed: (see below)  No results found for this or any previous visit (from the past 24 hour(s)).    No results found.    Active Problems:    Senile dementia with acute confusional state, with behavioral disturbance (12/16/2017)      ____________________________________________________________________      Attending Physician: Theodis Aguas, MD

## 2017-12-23 NOTE — Other (Signed)
Bedside and Verbal shift change report given to Sheryl A Spracklin   (oncoming nurse) by Autumn, RN (offgoing nurse). Report included the following information SBAR and Kardex.

## 2017-12-23 NOTE — Progress Notes (Signed)
Medical Progress Note      NAME: Angel Reed   DOB:  07/17/1934  MRN:             [id number]    Date/Time: 12/23/2017  12:35 PM      Assessment:     1. Dementia  2. Recent UTI  3. Fall    Plan:     ?? Patient has no agitation or any aggressive behavior  ?? Patient has confusion secondary to her dementia  ?? Medically patient is stable  ?? Discharge plan is being discussed with care manager and possible to skilled nursing facility  ?? Patient could be discharged anytime when discharge plan is in place      Subjective:     Patient is resting comfortably no issues overnight    Objective:     Vitals:    Last 24hrs VS reviewed since prior progress note. Most recent are:  Visit Vitals  BP 151/62 (BP 1 Location: Left arm, BP Patient Position: Supine)   Pulse 97   Temp 98.9 ??F (37.2 ??C)   Resp 20   Ht  (1.626 m)   Wt 90.7 kg (200 lb)   SpO2 94%   Breastfeeding? No   BMI 34.33 kg/m??     SpO2 Readings from Last 6 Encounters:   12/23/17 94%   12/09/17 96%            Intake/Output Summary (Last 24 hours) at 12/23/2017 1234  Last data filed at 12/23/2017 0928  Gross per 24 hour   Intake 0 ml   Output ???   Net 0 ml        Exam:     General:   Not in acute distress  HEENT: PERRLA, Neck Supple,  No JVD  Respiratory:   CTA bilaterally-no wheezes, rales, rhonchi, or crackles  Cardiac:  Regular Rate and Rythmn  - no murmurs, rubs or gallops  Abdominal:  Soft, non-tender, non-distended, positive bowel sounds  Extremities:  No cyanosis, or edema.  Skin: No rash  Neurological:  No focal neurological deficits    Medication:   Current Medications Reviewed    Current Facility-Administered Medications:   ???  QUEtiapine (SEROquel) tablet 12.5 mg, 12.5 mg, Oral, BID, Theodis Aguas, MD, Stopped at 12/21/17 2200  ???  haloperidol lactate (HALDOL) injection 1 mg, 1 mg, IntraVENous, Q8H PRN, Theodis Aguas, MD, 1 mg at 12/22/17 1623  ???  donepezil (ARICEPT) tablet 10 mg, 10 mg, Oral, QHS, Theodis Aguas, MD, Stopped at 12/21/17  2200  ???  levothyroxine (SYNTHROID) tablet 75 mcg, 75 mcg, Oral, ACB, Saquoia Sianez, Meta Hatchet, MD, Stopped at 12/23/17 0730  ???  memantine (NAMENDA) tablet 10 mg, 10 mg, Oral, BID, Theodis Aguas, MD, Stopped at 12/21/17 2200  ???  tolterodine ER (DETROL-LA) capsule 2 mg, 2 mg, Oral, DAILY, Tenny Craw, Meta Hatchet, MD, Stopped at 12/22/17 1000  ???  ondansetron (ZOFRAN) injection 4 mg, 4 mg, IntraVENous, Q4H PRN, Gwenyth Allegra, MD  ???  sodium chloride (NS) flush 5-10 mL, 5-10 mL, IntraVENous, Q8H, Jasarevic, Muhamed, MD, 10 mL at 12/23/17 0539  ???  sodium chloride (NS) flush 5-10 mL, 5-10 mL, IntraVENous, PRN, Irving Burton, MD  ???  naloxone (NARCAN) injection 0.1 mg, 0.1 mg, IntraVENous, PRN, Jasarevic, Muhamed, MD  ???  acetaminophen (TYLENOL) tablet 650 mg, 650 mg, Oral, Q6H PRN, Earlie Lou, Muhamed, MD, 650 mg at 12/18/17 2114  ???  heparin (porcine) injection 5,000 Units, 5,000 Units, SubCUTAneous,  Mariana Arn, MD, 5,000 Units at 12/23/17 0915  ???  nystatin (MYCOSTATIN) 100,000 unit/gram powder, , Topical, BID, Tenny Craw, Meta Hatchet, MD, Stopped at 12/23/17 1000      Lab:     Lab Data Reviewed: (see below)  No results found for this or any previous visit (from the past 24 hour(s)).    No results found.    Active Problems:    Senile dementia with acute confusional state, with behavioral disturbance (12/16/2017)      ____________________________________________________________________      Attending Physician: Theodis Aguas, MD

## 2017-12-23 NOTE — Progress Notes (Signed)
 NUTRITION FOLLOW UP    Current diet order: DIET CARDIAC Regular  DIET NUTRITIONAL SUPPLEMENTS Lunch, Dinner; Ensure [employer]    Vitamin/mineral supplementation: n/a     Other pertinent meds: synthroid, namenda     Current intake: []  N/A- NPO    []  very poor     []  poor      [x]  fair      []  good    Weight:   Last 3 Recorded Weights in this Encounter    12/16/17 2129 12/16/17 2131   Weight: 68 kg (150 lb) 90.7 kg (200 lb)       BMI: Body mass index is 34.33 kg/m.    Physical assessment:   Chewing/swallowing issues: ? Dysphagia - daughter reports hx of gulping hard, 2/2 throat pain, unsure if pt at risk - would rec SLP eval if needed. Denies chewing issues.    GI symptoms: abd soft, active BS; LBM 5/17 - formed    Fluid retention: none    Skin integrity: intact    Biochemical data: 5/20 - pert labs reviewed     Assessment of current MNT: Rec liberalize to regular diet - appropriate, adequate to meet nutrition needs if po >75% with meals. Continue Ensure Compact to increase calorie/pro intake opportunity.     Nutrition diagnosis:  1. Inadequate oral intake related to hx dementia, p/w agitation as evidenced by medical hx, po estimated to be <75% x >3 months (chronic) - continues    2. Swallowing difficulty (possible) related to dementia as evidenced by daughter reports hx of possible difficulty swallowing, throat pain (pt appears to be tolerating diet currently)     Nutrition recommendation: Regular diet, Ensure Compact BID (220 kcals, 9 gm protein each), assist patient with meals/feeding as needed     Nutrition monitoring: PO intake, diet tolerance and compliance, wt, BS, CMP, hydration, medical changes    Nutrition goals: PO intake >50-75% with meals, wt stable through LOS, nutrition related lab values WNL     Progress towards nutrition goals: []   Met/Ongoing     [x]   Progressing appropriately       []   Progressing slowly      []   Not Progressing    Nutrition level of care:  []  low     [x]  moderate     []   high    Code status: Full Code    Levorn Berber, RD  12/23/17

## 2017-12-23 NOTE — Progress Notes (Signed)
Per Angel Reed with [facility] he is not going to assist in completing a LTC Medicaid application at this time. After getting off the phone with the patient's daughter, Angel Reed he is unsure if the client will meet the financial criteria for Medicaid. He stated that Angel Reed was elusive on providing information about her mother's income and the resources. He will provide her with an application for Medicaid and the Medicaid Handbook section discussing the income and resource guidelines for Medicaid.

## 2017-12-23 NOTE — Procedures (Signed)
[facility]  Electroencephalogram  Angel Reed, Angel Reed   DATE: 12/22/2017  EEG#: [id number]  DOB: 1934/04/05  MR#    [id number]  ROOM:  5EST  ACCT#  [id number]  SEX: F  REFERRING PHYSICIAN: Meta Hatchet TEREFE          EEG DESCRIPTION:  The EEG is performed by applying international 10/20 standard electrode placement system and using Cadwell 16-channel digital EEG recording machine.    CLINICAL HISTORY:  The EEG is performed on an 84 year old female for evaluation of altered mental status and dementia.    The patient is somnolent and restless throughout the recording of this tracing.  Frequent eye movements, movements and muscle artifacts are seen during the recording of this tracing.  No EEG changes of sleep are seen.  Frequent intermittent 3 to 4 Hz delta slowing is seen in a generalized fashion bilaterally.  No abnormal paroxysmal discharges are seen.  No epileptiform activity is seen.    Hyperventilation is not performed.    Photic stimulation is unremarkable.    IMPRESSION:  This is an abnormal EEG due to intermittent 3 to 4 Hz delta slowing seen in a generalized fashion bilaterally consistent with acute toxic metabolic encephalopathy or chronic degenerative disorder of the nervous system.  No epileptiform activity is seen.      ___________________  Maryjo Rochester MD  Dictated By: .   SB  D:12/23/2017 08:55:14  T: 12/23/2017 10:28:03  [id number]

## 2017-12-23 NOTE — Progress Notes (Signed)
Inpatient order obtained for continued complex discharge needs, pending guardianship, ongoing therapy treatments. SW has been consulted for [facility] evaluation and the referral has been sent to [facility] for MCD screening. Per CM Manager, Verita Schneiders, she has been loaded in e-dc and the referral has been sent to area SNFs for admission review.

## 2017-12-24 MED ORDER — DEXTROSE 5% IN NORMAL SALINE IV
INTRAVENOUS | Status: DC
Start: 2017-12-24 — End: 2017-12-26
  Administered 2017-12-24 – 2017-12-26 (×4): via INTRAVENOUS

## 2017-12-24 MED FILL — BD POSIFLUSH NORMAL SALINE 0.9 % INJECTION SYRINGE: INTRAMUSCULAR | Qty: 10

## 2017-12-24 MED FILL — QUETIAPINE 25 MG TAB: 25 mg | ORAL | Qty: 1

## 2017-12-24 MED FILL — DEXTROSE 5% IN NORMAL SALINE IV: INTRAVENOUS | Qty: 1000

## 2017-12-24 MED FILL — MEMANTINE 5 MG TAB: 5 mg | ORAL | Qty: 2

## 2017-12-24 MED FILL — HEPARIN (PORCINE) 5,000 UNIT/ML IJ SOLN: 5000 unit/mL | INTRAMUSCULAR | Qty: 1

## 2017-12-24 MED FILL — LEVOTHYROXINE 25 MCG TAB: 25 mcg | ORAL | Qty: 1

## 2017-12-24 MED FILL — TOLTERODINE SR 2 MG 24 HR CAP: 2 mg | ORAL | Qty: 1

## 2017-12-24 NOTE — Progress Notes (Signed)
Speech Language Pathology Swallowing  Evaluation         12/24/2017    Angel Reed    [id number]    Swallow Eval Time:  15:50-16:05     IMPRESSIONS:  Clinical swallowing evaluation revealed s.s severe oral dysphagia as evidenced by impaired bolus retrieval and minimal bolus manipulation with majority of the puree bolus suctioned from the oral cavity.  In addition, patient had minimal hyolaryngeal elevation to palpation during dx PO trials this date.   Based upon current presentation, do not advise PO nutrition/hydration.  In addition due to severity of her oral dysphagia, patient is not exhibiting readiness for further assessment of swallowing function via MBSS. Consider at least short term alternate means of nutrition/hydration/medication.  Suggest palliative care consult for goals/scope of care.   D/w Dr. Tenny Craw.  ST will follow.     >Factors Increasing Patient???s Risk of Aspiration: debilitation, age , decreased cognitive function, acute medical status, dependence for feeding and AMS    >Therapy Prognosis: Guarded       RECOMMENDATIONS:    1. Diet: Do not advise PO nutrition/hydration.  Consider at least short term alternate means of nutrition/hydration/medication.      2. Aspiration Precautions: Meticulous oral care and Suction set up    3. Other Services: PT, OT, Dietitian, Palliative care    4. Acute Care Therapy: To improve swallowing function/safety,  ST will follow for 2 weeks 1-3 x per week  For dysphagia tx as patient's condition and ST schedule allow.    5. Therapy Upon Discharge: SNF as indicated at time of discharge      EDUCATION:    Education provided: Results of evaluation, SLP recommendations, POC and Deficits    Individual Educated: Patient, Doctor and Nursing     Comprehension: Patient did not communicate comprehension    Education not provided secondary to: Family not present    Please refer to Patient Education and Care Plan sections of chart for additional details.       HISTORY OF PRESENT ILLNESS: Patient is an 84 year-old female who presented to ED via EMS due to combative/aggressive behavior at home.  Her hospital course has included the following: dementia with behavioral disturbances/agitation; recent UTI; fall.      Imaging:     >MRI 5/19  1.  Motion degraded study. No acute infarct or hemorrhage.  2.  Moderate cortical atrophy.  3.  Moderate chronic microvascular ischemic change.    >CXR 5/7:Normal study.    Isolation:  There are currently no Active Isolations       MDRO: No current active infections    PAST MEDICAL HISTORY :     Past Medical History:   Diagnosis Date   ??? Agitation    ??? Dementia    ??? UTI (urinary tract infection)          Prior Level of Function:  ? Communication Status: H/o dementi awith combative/aggressive behavior leading to ED presentation.   No family was present at time of assessment to provide additional information re: baseline communication    ? Living Environment: lived with daughter    ? Dysphagia Hx/Diet:  No family was present at time of assessment to provide information re: swallowing      EVALUATION    General Observation: Patient was lying supine in bed with her eyes open.  Open mouth breathing & resting posture.  Vocalizations were limited and often incomprehensible due to minimal movement of lips and tongue.  Subjective/Patient Goal: Unable to state    Pain: No pain reported, No pain s/s noted    Respiratory: Room air.      Current Diet: Regular and Thin    Oral Motor Assessment: Open mouth resting/breathing posture.  Limited upper dentition.  Oral mucosa appeared dry.  Limited lingual protrusion and lateralization.  Minimal labial protrusion and retraction.     Motor Speech/Voice Production: Limited vocalizations which were often incomprehensible due to minimal movement of articulators.     Swallowing Assessment:  Nurse reported that patient has been holding foods in mouth since receiving sedation for MRI over weekend.  Patient was  reclined in bed with open mouth resting/breathing posture when SLP arrived.  Nurse and SLP repositioned patient upright in bed.   Clinical swallowing evaluation included dx PO trials of the following:  ice chips; thin liquids via spoon and attempted via straw;  puree.   Very limited labial rounding /seal for active bolus retrieval from spoon resulting in anterior loss with thin liquids.  Patient approximated labial closure but unable to retrieve liquid via straw despite max cues. Single ice chip placed in oral cavity.  Little to no mastication/bolus manipulation with ice and puree.  Majority of puree bolus accumulated within the anterior sulcus from where it was suctioned.  Hyolaryngeal elevation to palpation was minimal and was suspected significantly dis-coordinated. Dumping quality accompanied thin liquid trial.  No cough, throat clear, or change to voice.         Morton Stall. Vittur, MSP/CCC-SLP (Pager: [phone])

## 2017-12-24 NOTE — Progress Notes (Signed)
Medical Progress Note      NAME: Angel Reed   DOB:  Dec 19, 1933  MRN:             [id number]    Date/Time: 12/24/2017  12:08 PM      Assessment:     1. Dementia  2. Recent UTI  3. Fall    Plan:   ?? No change in patient's status she remains stable   ?? Continue current medical management   ?? Awaiting discharge placement   ?? We will continue monitor    Subjective:   No acute issues    Objective:     Vitals:    Last 24hrs VS reviewed since prior progress note. Most recent are:  Visit Vitals  BP 146/74 (BP 1 Location: Left arm, BP Patient Position: Supine)   Pulse 94   Temp 97.8 ??F (36.6 ??C)   Resp 20   Ht  (1.626 m)   Wt 90.7 kg (200 lb)   SpO2 92%   Breastfeeding? No   BMI 34.33 kg/m??     SpO2 Readings from Last 6 Encounters:   12/24/17 92%   12/09/17 96%            Intake/Output Summary (Last 24 hours) at 12/24/2017 1324  Last data filed at 12/24/2017 0950  Gross per 24 hour   Intake 120 ml   Output ???   Net 120 ml        Exam:     General:   Not in acute distress  HEENT: PERRLA, Neck Supple,  No JVD  Respiratory:   CTA bilaterally-no wheezes, rales, rhonchi, or crackles  Cardiac:  Regular Rate and Rythmn  - no murmurs, rubs or gallops  Abdominal:  Soft, non-tender, non-distended, positive bowel sounds  Extremities:  No cyanosis, or edema.  Skin: No rash  Neurological:  No focal neurological deficits    Medication:   Current Medications Reviewed    Current Facility-Administered Medications:   ???  QUEtiapine (SEROquel) tablet 12.5 mg, 12.5 mg, Oral, BID, Theodis Aguas, MD, 12.5 mg at 12/24/17 4540  ???  haloperidol lactate (HALDOL) injection 1 mg, 1 mg, IntraVENous, Q8H PRN, Theodis Aguas, MD, 1 mg at 12/23/17 1409  ???  donepezil (ARICEPT) tablet 10 mg, 10 mg, Oral, QHS, Theodis Aguas, MD, Stopped at 12/21/17 2200  ???  levothyroxine (SYNTHROID) tablet 75 mcg, 75 mcg, Oral, ACB, Samanthia Howland, Meta Hatchet, MD, Stopped at 12/23/17 0730  ???  memantine (NAMENDA) tablet 10 mg, 10 mg, Oral, BID, Theodis Aguas,  MD, 10 mg at 12/24/17 9811  ???  tolterodine ER (DETROL-LA) capsule 2 mg, 2 mg, Oral, DAILY, Claus Silvestro, Meta Hatchet, MD, 2 mg at 12/24/17 9147  ???  ondansetron (ZOFRAN) injection 4 mg, 4 mg, IntraVENous, Q4H PRN, Gwenyth Allegra, MD  ???  sodium chloride (NS) flush 5-10 mL, 5-10 mL, IntraVENous, Q8H, Jasarevic, Muhamed, MD, 10 mL at 12/24/17 0530  ???  sodium chloride (NS) flush 5-10 mL, 5-10 mL, IntraVENous, PRN, Irving Burton, MD  ???  naloxone (NARCAN) injection 0.1 mg, 0.1 mg, IntraVENous, PRN, Jasarevic, Muhamed, MD  ???  acetaminophen (TYLENOL) tablet 650 mg, 650 mg, Oral, Q6H PRN, Earlie Lou, Muhamed, MD, 650 mg at 12/18/17 2114  ???  heparin (porcine) injection 5,000 Units, 5,000 Units, SubCUTAneous, Q12H, Irving Burton, MD, 5,000 Units at 12/24/17 0905  ???  nystatin (MYCOSTATIN) 100,000 unit/gram powder, , Topical, BID, Pa Tennant, Meta Hatchet, MD      Lab:  Lab Data Reviewed: (see below)  No results found for this or any previous visit (from the past 24 hour(s)).    No results found.    Active Problems:    Senile dementia with acute confusional state, with behavioral disturbance (12/16/2017)      UTI (urinary tract infection) (12/23/2017)      Fall (12/23/2017)      Dementia (12/23/2017)      Discharge planning issues (12/23/2017)      ____________________________________________________________________      Attending Physician: Theodis Aguas, MD

## 2017-12-24 NOTE — Other (Signed)
Bedside and Verbal shift change report given to Phyllis Williams, RN (oncoming nurse) by Carmelo Calimlim, RN (offgoing nurse). Report included the following information SBAR and Kardex.

## 2017-12-24 NOTE — Other (Signed)
Bedside shift change report given to Arnel RN (oncoming nurse) by Autumn RN (offgoing nurse). Report included the following information SBAR and Kardex.

## 2017-12-24 NOTE — Progress Notes (Signed)
Medical Progress Note      NAME: Angel Reed   DOB:  26-Nov-1933  MRN:             [id number]    Date/Time: 12/24/2017  12:08 PM      Assessment:     1. Dementia  2. Recent UTI  3. Fall    Plan:     ?? No change in patient's status she remains stable   ?? Continue current medical management   ?? Awaiting discharge placement   ?? We will continue monitor      Subjective:     No acute issues    Objective:     Vitals:    Last 24hrs VS reviewed since prior progress note. Most recent are:  Visit Vitals  BP 146/74 (BP 1 Location: Left arm, BP Patient Position: Supine)   Pulse 94   Temp 97.8 ??F (36.6 ??C)   Resp 20   Ht  (1.626 m)   Wt 90.7 kg (200 lb)   SpO2 92%   Breastfeeding? No   BMI 34.33 kg/m??     SpO2 Readings from Last 6 Encounters:   12/24/17 92%   12/09/17 96%            Intake/Output Summary (Last 24 hours) at 12/24/2017 1324  Last data filed at 12/24/2017 0950  Gross per 24 hour   Intake 120 ml   Output ???   Net 120 ml        Exam:     General:   Not in acute distress  HEENT: PERRLA, Neck Supple,  No JVD  Respiratory:   CTA bilaterally-no wheezes, rales, rhonchi, or crackles  Cardiac:  Regular Rate and Rythmn  - no murmurs, rubs or gallops  Abdominal:  Soft, non-tender, non-distended, positive bowel sounds  Extremities:  No cyanosis, or edema.  Skin: No rash  Neurological:  No focal neurological deficits    Medication:   Current Medications Reviewed    Current Facility-Administered Medications:   ???  QUEtiapine (SEROquel) tablet 12.5 mg, 12.5 mg, Oral, BID, Theodis Aguas, MD, 12.5 mg at 12/24/17 4540  ???  haloperidol lactate (HALDOL) injection 1 mg, 1 mg, IntraVENous, Q8H PRN, Theodis Aguas, MD, 1 mg at 12/23/17 1409  ???  donepezil (ARICEPT) tablet 10 mg, 10 mg, Oral, QHS, Theodis Aguas, MD, Stopped at 12/21/17 2200  ???  levothyroxine (SYNTHROID) tablet 75 mcg, 75 mcg, Oral, ACB, Zanaya Baize, Meta Hatchet, MD, Stopped at 12/23/17 0730  ???  memantine (NAMENDA) tablet 10 mg, 10 mg, Oral, BID, Theodis Aguas, MD, 10 mg at 12/24/17 9811  ???  tolterodine ER (DETROL-LA) capsule 2 mg, 2 mg, Oral, DAILY, Garan Frappier, Meta Hatchet, MD, 2 mg at 12/24/17 9147  ???  ondansetron (ZOFRAN) injection 4 mg, 4 mg, IntraVENous, Q4H PRN, Gwenyth Allegra, MD  ???  sodium chloride (NS) flush 5-10 mL, 5-10 mL, IntraVENous, Q8H, Jasarevic, Muhamed, MD, 10 mL at 12/24/17 0530  ???  sodium chloride (NS) flush 5-10 mL, 5-10 mL, IntraVENous, PRN, Irving Burton, MD  ???  naloxone (NARCAN) injection 0.1 mg, 0.1 mg, IntraVENous, PRN, Jasarevic, Muhamed, MD  ???  acetaminophen (TYLENOL) tablet 650 mg, 650 mg, Oral, Q6H PRN, Earlie Lou, Muhamed, MD, 650 mg at 12/18/17 2114  ???  heparin (porcine) injection 5,000 Units, 5,000 Units, SubCUTAneous, Q12H, Irving Burton, MD, 5,000 Units at 12/24/17 0905  ???  nystatin (MYCOSTATIN) 100,000 unit/gram powder, , Topical, BID, Garrie Woodin, Meta Hatchet, MD  Lab:     Lab Data Reviewed: (see below)  No results found for this or any previous visit (from the past 24 hour(s)).    No results found.    Active Problems:    Senile dementia with acute confusional state, with behavioral disturbance (12/16/2017)      UTI (urinary tract infection) (12/23/2017)      Fall (12/23/2017)      Dementia (12/23/2017)      Discharge planning issues (12/23/2017)      ____________________________________________________________________      Attending Physician: Theodis Aguas, MD

## 2017-12-24 NOTE — Progress Notes (Signed)
Speech Language Pathology Swallowing  Evaluation         12/24/2017    Angel Reed    [id number]    Swallow Eval Time:  15:50-16:05       IMPRESSIONS:  Clinical swallowing evaluation revealed s.s severe oral dysphagia as evidenced by impaired bolus retrieval and minimal bolus manipulation with majority of the puree bolus suctioned from the oral cavity.  In addition, patient had minimal hyolaryngeal elevation to palpation during dx PO trials this date.   Based upon current presentation, do not advise PO nutrition/hydration.  In addition due to severity of her oral dysphagia, patient is not exhibiting readiness for further assessment of swallowing function via MBSS. Consider at least short term alternate means of nutrition/hydration/medication.  Suggest palliative care consult for goals/scope of care.   D/w Dr. Tenny Craw.  ST will follow.     >Factors Increasing Patient's Risk of Aspiration: debilitation, age , decreased cognitive function, acute medical status, dependence for feeding and AMS    >Therapy Prognosis: Guarded       RECOMMENDATIONS:    1. Diet: Do not advise PO nutrition/hydration.  Consider at least short term alternate means of nutrition/hydration/medication.      2. Aspiration Precautions: Meticulous oral care and Suction set up    3. Other Services: PT, OT, Dietitian, Palliative care    4. Acute Care Therapy: To improve swallowing function/safety,  ST will follow for 2 weeks 1-3 x per week  For dysphagia tx as patient's condition and ST schedule allow.    5. Therapy Upon Discharge: SNF as indicated at time of discharge      EDUCATION:    Education provided: Results of evaluation, SLP recommendations, POC and Deficits    Individual Educated: Patient, Doctor and Nursing     Comprehension: Patient did not communicate comprehension    Education not provided secondary to: Family not present    Please refer to Patient Education and Care Plan sections of chart for additional details.      HISTORY OF PRESENT  ILLNESS: Patient is an 84 year-old female who presented to ED via EMS due to combative/aggressive behavior at home.  Her hospital course has included the following: dementia with behavioral disturbances/agitation; recent UTI; fall.      Imaging:     >MRI 5/19  1.  Motion degraded study. No acute infarct or hemorrhage.  2.  Moderate cortical atrophy.  3.  Moderate chronic microvascular ischemic change.    >CXR 5/7:Normal study.    Isolation:  There are currently no Active Isolations       MDRO: No current active infections      PAST MEDICAL HISTORY :     Past Medical History:   Diagnosis Date   . Agitation    . Dementia    . UTI (urinary tract infection)          Prior Level of Function:  . Communication Status: H/o dementi awith combative/aggressive behavior leading to ED presentation.   No family was present at time of assessment to provide additional information re: baseline communication    . Living Environment: lived with daughter    . Dysphagia Hx/Diet:  No family was present at time of assessment to provide information re: swallowing      EVALUATION    General Observation: Patient was lying supine in bed with her eyes open.  Open mouth breathing & resting posture.  Vocalizations were limited and often incomprehensible due to minimal movement of lips  and tongue.     Subjective/Patient Goal: Unable to state    Pain: No pain reported, No pain s/s noted    Respiratory: Room air.      Current Diet: Regular and Thin    Oral Motor Assessment: Open mouth resting/breathing posture.  Limited upper dentition.  Oral mucosa appeared dry.  Limited lingual protrusion and lateralization.  Minimal labial protrusion and retraction.     Motor Speech/Voice Production: Limited vocalizations which were often incomprehensible due to minimal movement of articulators.     Swallowing Assessment:  Nurse reported that patient has been holding foods in mouth since receiving sedation for MRI over weekend.  Patient was reclined in bed with  open mouth resting/breathing posture when SLP arrived.  Nurse and SLP repositioned patient upright in bed.   Clinical swallowing evaluation included dx PO trials of the following:  ice chips; thin liquids via spoon and attempted via straw;  puree.   Very limited labial rounding /seal for active bolus retrieval from spoon resulting in anterior loss with thin liquids.  Patient approximated labial closure but unable to retrieve liquid via straw despite max cues. Single ice chip placed in oral cavity.  Little to no mastication/bolus manipulation with ice and puree.  Majority of puree bolus accumulated within the anterior sulcus from where it was suctioned.  Hyolaryngeal elevation to palpation was minimal and was suspected significantly dis-coordinated. Dumping quality accompanied thin liquid trial.  No cough, throat clear, or change to voice.         Morton Stall. Vittur, MSP/CCC-SLP (Pager: [phone])

## 2017-12-25 MED FILL — MEMANTINE 5 MG TAB: 5 mg | ORAL | Qty: 2

## 2017-12-25 MED FILL — LEVOTHYROXINE 25 MCG TAB: 25 mcg | ORAL | Qty: 1

## 2017-12-25 MED FILL — HEPARIN (PORCINE) 5,000 UNIT/ML IJ SOLN: 5000 unit/mL | INTRAMUSCULAR | Qty: 1

## 2017-12-25 MED FILL — BD POSIFLUSH NORMAL SALINE 0.9 % INJECTION SYRINGE: INTRAMUSCULAR | Qty: 10

## 2017-12-25 MED FILL — DONEPEZIL 10 MG TAB: 10 mg | ORAL | Qty: 1

## 2017-12-25 MED FILL — QUETIAPINE 25 MG TAB: 25 mg | ORAL | Qty: 1

## 2017-12-25 MED FILL — TOLTERODINE SR 2 MG 24 HR CAP: 2 mg | ORAL | Qty: 1

## 2017-12-25 NOTE — Progress Notes (Addendum)
NUTRITION FOLLOW UP  ??  Current diet order: DIET CARDIAC Regular  DIET NUTRITIONAL SUPPLEMENTS Lunch, Dinner; [employer]  ??  Vitamin/mineral supplementation:??D5/NS @ 75 ml/hr   ??  Other pertinent meds: synthroid, namenda  ??  Current intake:??_0 ??N/A- NPO ??????_1 ??very poor ??- refused meal 5/22 PM??????_2 ??poor ??????????_3 ??fair ??????????_4 ??good  ??  Weight:        Last 3 Recorded Weights in this Encounter   ?? 12/16/17 2129 12/16/17 2131   Weight: 68 kg (150 lb) 90.7 kg (200 lb)   ??  ??  BMI: Body mass index is 34.33 kg/m??.    Unsure of weight discrepancy on 5/14 (suspect error, usual weight per daughter from initial RD assessment is 200#)   ??  Physical assessment:  ?? Chewing/swallowing issues: SLP 5/22 - do not advise po nutrition/hydration, consider at least short term alternate means of nutrition/hydration and medications, suggest pall care c/s for GOC  ?? GI symptoms: abd soft, hypoactive BS; LBM 5/22 - soft  ?? Fluid retention: none   ?? Skin integrity: intact  ??  Biochemical data: 5/20 - pert labs reviewed (no new CMP since 5/20)  ??  Assessment of current MNT: Rec alternate route of nutrition support (e.g. Enteral TF) if within goals of care, appropriate per SLP guidelines and recs. Adequate to meet nutrition needs if tolerating TF at goal with no s/s GI intolerance, no s/s aspiration, min GRV < 500 ml   ??  Nutrition diagnosis:  1. Inadequate oral intake??related to hx dementia, p/w agitation??as evidenced by medical hx, po estimated to be <75% x >3 months (chronic) - continues  ??  2. Swallowing difficulty (possible)??related to dementia??as evidenced by daughter reports hx of possible difficulty swallowing, throat pain (continues - SLP Eval rec'd NPO)  ??  Nutrition recommendation:   If unable to advance PO diet 2/2 dysphagia, rec d/c PO diet, and rec enteral TF if within goals of care (rec Pall Care c/s)     If starting TF, rec Jevity 1.5 @ 40 ml/hr, plus Prosource 1 packet BID (1560 kcals, 91 gm protein, 790 ml free water)      Rec adjust free water flushes accordingly per IVF - pt receiving IVF @ 75 ml/hr at this time. If IVF discontinued, rec add 240 ml free water flush q4 hours (provides total volume of 2230 ml daily)    ??  Nutrition monitoring:??TF tolerance/SLP eval input/Pall Care re: GOC, wt, BS, CMP, hydration, medical changes  ??  Nutrition goals:??Tol TF at goal with no s/s aspiration, no s/s GI intolerance, min GRV < 525m, wt stable through LOS, nutrition related lab values WNL??  ??  Progress towards nutrition goals: _5   Met/Ongoing     _6   Progressing appropriately       _7   Progressing slowly      _8   Not Progressing  ??  Nutrition level of care:  _9  low     _10  moderate     _11  high  ??  Code status: Full Code  ??  RMurlean Hark RD  12/25/17

## 2017-12-25 NOTE — Other (Signed)
Bedside and Verbal shift change report given to Maria Theresa Fortus Ongoco, RN   (oncoming nurse) by Amparo, rn (offgoing nurse). Report included the following information SBAR and Kardex.

## 2017-12-25 NOTE — Progress Notes (Signed)
Problem: Falls - Risk of  Goal: *Absence of Falls  Description  Document Schmid Fall Risk and appropriate interventions in the flowsheet.  Outcome: Progressing Towards Goal  Note:   Fall Risk Interventions:  Mobility Interventions: Utilize walker, cane, or other assistive device, Bed/chair exit alarm    Mentation Interventions: Adequate sleep, hydration, pain control, Bed/chair exit alarm, More frequent rounding    Medication Interventions: Bed/chair exit alarm    Elimination Interventions: Call light in reach, Bed/chair exit alarm    History of Falls Interventions: Bed/chair exit alarm, Door open when patient unattended

## 2017-12-25 NOTE — Progress Notes (Signed)
Speech-Language-Pathology Screen/Attempt:     12/25/2017    Angel Reed    [id number]    Time: 13:16  Patient was lying in bed with open mouth/breathing posture; sleeping soundly; did not awaken in response to max stim (voice, sternal rub, moist tootheethe to outside of lips). Based upon current presentation, PO is not advised for this patient at this time.     Our service will continue to follow for evaluation as schedule and patient's condition allow.     Thank you,  ??  Cy Blamer, M.S. CCC-SLP  Speech-Language-Pathologist  Pager: [phone]

## 2017-12-25 NOTE — Progress Notes (Signed)
TC to Rhonda with [facility] [phone].  Rhonda has many questions regarding the care of the patient and the financial situation of the patient.  SW directed her to contact the daughter, Cynthia for the financial piece.  SW did give her the basic needs of the patient (ADL's) and that the patient has dementia with some behaviors.      Rhonda will contact the daughter and then call this SW with an update.  SW will continue to follow.

## 2017-12-25 NOTE — Progress Notes (Signed)
Medical Progress Note      NAME: Angel Reed   DOB:  1933-10-09  MRN:             [id number]    Date/Time: 12/25/2017  12:48 PM      Assessment:     1. Dementia  2. Recent UTI  3. Fall  4. Poor oral intake    Plan:   ?? Patient had no oral intake at this time   ?? Swallow evaluation was unable to complete and speech-language pathology not able to recommend any diet at this time   ?? The other option is to give her a feeding to if the family wished to proceed with that   ?? I tried to reach her daughter and unable to reach today and left a voicemail   ?? We will continue with IV fluid hydration for now     Subjective:   Not eating or taking anything by mouth    Objective:     Vitals:    Last 24hrs VS reviewed since prior progress note. Most recent are:  Visit Vitals  BP 167/56 (BP 1 Location: Left arm, BP Patient Position: Supine)   Pulse 68   Temp 98.1 ??F (36.7 ??C)   Resp 18   Ht  (1.626 m)   Wt 90.7 kg (200 lb)   SpO2 97%   Breastfeeding? No   BMI 34.33 kg/m??     SpO2 Readings from Last 6 Encounters:   12/25/17 97%   12/09/17 96%            Intake/Output Summary (Last 24 hours) at 12/25/2017 1248  Last data filed at 12/25/2017 0930  Gross per 24 hour   Intake 1015 ml   Output ???   Net 1015 ml        Exam:     General:   Not in acute distress  HEENT: PERRLA, Neck Supple,  No JVD  Respiratory:   CTA bilaterally-no wheezes, rales, rhonchi, or crackles  Cardiac:  Regular Rate and Rythmn  - no murmurs, rubs or gallops  Abdominal:  Soft, non-tender, non-distended, positive bowel sounds  Extremities:  No cyanosis, or edema.  Skin: No rash  Neurological:  No focal neurological deficits    Medication:   Current Medications Reviewed    Current Facility-Administered Medications:   ???  dextrose 5% and 0.9% NaCl infusion, 75 mL/hr, IntraVENous, CONTINUOUS, Raechal Raben, Meta Hatchet, MD, Last Rate: 75 mL/hr at 12/25/17 0559, 75 mL/hr at 12/25/17 0559  ???  QUEtiapine (SEROquel) tablet 12.5 mg, 12.5 mg, Oral, BID, Theodis Aguas, MD, Stopped at 12/24/17 2200  ???  haloperidol lactate (HALDOL) injection 1 mg, 1 mg, IntraVENous, Q8H PRN, Theodis Aguas, MD, 1 mg at 12/23/17 1409  ???  donepezil (ARICEPT) tablet 10 mg, 10 mg, Oral, QHS, Theodis Aguas, MD, Stopped at 12/21/17 2200  ???  levothyroxine (SYNTHROID) tablet 75 mcg, 75 mcg, Oral, ACB, Solita Macadam, Meta Hatchet, MD, 75 mcg at 12/25/17 4540  ???  memantine (NAMENDA) tablet 10 mg, 10 mg, Oral, BID, Theodis Aguas, MD, Stopped at 12/24/17 2200  ???  tolterodine ER (DETROL-LA) capsule 2 mg, 2 mg, Oral, DAILY, Tenny Craw, Meta Hatchet, MD, Stopped at 12/25/17 1002  ???  ondansetron (ZOFRAN) injection 4 mg, 4 mg, IntraVENous, Q4H PRN, Gwenyth Allegra, MD  ???  sodium chloride (NS) flush 5-10 mL, 5-10 mL, IntraVENous, Q8H, Jasarevic, Muhamed, MD, 10 mL at 12/25/17 0534  ???  sodium chloride (  NS) flush 5-10 mL, 5-10 mL, IntraVENous, PRN, Irving Burton, MD  ???  naloxone (NARCAN) injection 0.1 mg, 0.1 mg, IntraVENous, PRN, Jasarevic, Muhamed, MD  ???  acetaminophen (TYLENOL) tablet 650 mg, 650 mg, Oral, Q6H PRN, Earlie Lou, Muhamed, MD, 650 mg at 12/18/17 2114  ???  heparin (porcine) injection 5,000 Units, 5,000 Units, SubCUTAneous, Q12H, Irving Burton, MD, 5,000 Units at 12/25/17 1001  ???  nystatin (MYCOSTATIN) 100,000 unit/gram powder, , Topical, BID, Delmas Faucett, Meta Hatchet, MD      Lab:     Lab Data Reviewed: (see below)  No results found for this or any previous visit (from the past 24 hour(s)).    No results found.    Active Problems:    Senile dementia with acute confusional state, with behavioral disturbance (12/16/2017)      UTI (urinary tract infection) (12/23/2017)      Fall (12/23/2017)      Dementia (12/23/2017)      Discharge planning issues (12/23/2017)      ____________________________________________________________________      Attending Physician: Theodis Aguas, MD

## 2017-12-25 NOTE — Progress Notes (Addendum)
TC from Rhonda, [facility] [phone].  She has spoken to the daughter and they are hoping to make placement into the home.  Rhonda is coming to the hospital to see the patient this evening (12/25/2017)  The daughter will make arrangements to see the home.  If all things work out, patient will go to [facility].        Rhonda was given Marsha Hogge number to contact if this SW isn't available

## 2017-12-25 NOTE — Progress Notes (Signed)
Problem: Falls - Risk of  Goal: *Absence of Falls  Description  Document Schmid Fall Risk and appropriate interventions in the flowsheet.  Outcome: Progressing Towards Goal     Problem: Patient Education: Go to Patient Education Activity  Goal: Patient/Family Education  Outcome: Progressing Towards Goal     Problem: Pressure Injury - Risk of  Goal: *Prevention of pressure injury  Description  Document Braden Scale and appropriate interventions in the flowsheet.  Outcome: Progressing Towards Goal     Problem: Patient Education: Go to Patient Education Activity  Goal: Patient/Family Education  Outcome: Progressing Towards Goal

## 2017-12-25 NOTE — Other (Signed)
Bedside shift change report given to Ampy Sawi,RN (oncoming nurse) by Phyllis Williams,RN (offgoing nurse). Report included the following information SBAR, MAR and Recent Results.

## 2017-12-25 NOTE — Progress Notes (Signed)
Late entry    SW spoke to the patient's daughter last night.  SW requested information regarding the patient's income.  The daughter was reluctant to provide information regarding her mother's income but later reported during the conversation that her mother is retired [employer] employee who receives pension benefits of $1,080.00 and $100.00 from Social Security benefits.  Patient's daughter stated she had been estranged from her mother before she became ill.

## 2017-12-25 NOTE — Progress Notes (Signed)
 Speech-Language-Pathology Screen/Attempt:     12/25/2017    Sharman Parks    [id number]    Time: 13:16    Patient was lying in bed with open mouth/breathing posture; sleeping soundly; did not awaken in response to max stim (voice, sternal rub, moist tootheethe to outside of lips). Based upon current presentation, PO is not advised for this patient at this time.     Our service will continue to follow for evaluation as schedule and patient's condition allow.     Thank you,    Ronnald CHARLENA Nigh, M.S. CCC-SLP  Speech-Language-Pathologist  Pager: [phone]

## 2017-12-25 NOTE — Progress Notes (Signed)
Late entry    SW spoke to the patient's daughter last night.  SW requested information regarding the patient's income.  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  Patient's daughter stated she had been estranged from her mother before she became ill.

## 2017-12-25 NOTE — Progress Notes (Signed)
Medical Progress Note      NAME: Angel Reed   DOB:  05/31/1934  MRN:             [id number]    Date/Time: 12/25/2017  12:48 PM      Assessment:     1. Dementia  2. Recent UTI  3. Fall  4. Poor oral intake    Plan:     ?? Patient had no oral intake at this time   ?? Swallow evaluation was unable to complete and speech-language pathology not able to recommend any diet at this time   ?? The other option is to give her a feeding to if the family wished to proceed with that   ?? I tried to reach her daughter and unable to reach today and left a voicemail   ?? We will continue with IV fluid hydration for now     Subjective:     Not eating or taking anything by mouth    Objective:     Vitals:    Last 24hrs VS reviewed since prior progress note. Most recent are:  Visit Vitals  BP 167/56 (BP 1 Location: Left arm, BP Patient Position: Supine)   Pulse 68   Temp 98.1 ??F (36.7 ??C)   Resp 18   Ht  (1.626 m)   Wt 90.7 kg (200 lb)   SpO2 97%   Breastfeeding? No   BMI 34.33 kg/m??     SpO2 Readings from Last 6 Encounters:   12/25/17 97%   12/09/17 96%            Intake/Output Summary (Last 24 hours) at 12/25/2017 1248  Last data filed at 12/25/2017 0930  Gross per 24 hour   Intake 1015 ml   Output ???   Net 1015 ml        Exam:     General:   Not in acute distress  HEENT: PERRLA, Neck Supple,  No JVD  Respiratory:   CTA bilaterally-no wheezes, rales, rhonchi, or crackles  Cardiac:  Regular Rate and Rythmn  - no murmurs, rubs or gallops  Abdominal:  Soft, non-tender, non-distended, positive bowel sounds  Extremities:  No cyanosis, or edema.  Skin: No rash  Neurological:  No focal neurological deficits    Medication:   Current Medications Reviewed    Current Facility-Administered Medications:   ???  dextrose 5% and 0.9% NaCl infusion, 75 mL/hr, IntraVENous, CONTINUOUS, Shade Rivenbark, Meta Hatchet, MD, Last Rate: 75 mL/hr at 12/25/17 0559, 75 mL/hr at 12/25/17 0559  ???  QUEtiapine (SEROquel) tablet 12.5 mg, 12.5 mg, Oral, BID, Theodis Aguas, MD, Stopped at 12/24/17 2200  ???  haloperidol lactate (HALDOL) injection 1 mg, 1 mg, IntraVENous, Q8H PRN, Theodis Aguas, MD, 1 mg at 12/23/17 1409  ???  donepezil (ARICEPT) tablet 10 mg, 10 mg, Oral, QHS, Theodis Aguas, MD, Stopped at 12/21/17 2200  ???  levothyroxine (SYNTHROID) tablet 75 mcg, 75 mcg, Oral, ACB, Rodneisha Bonnet, Meta Hatchet, MD, 75 mcg at 12/25/17 8295  ???  memantine (NAMENDA) tablet 10 mg, 10 mg, Oral, BID, Theodis Aguas, MD, Stopped at 12/24/17 2200  ???  tolterodine ER (DETROL-LA) capsule 2 mg, 2 mg, Oral, DAILY, Tenny Craw, Meta Hatchet, MD, Stopped at 12/25/17 1002  ???  ondansetron (ZOFRAN) injection 4 mg, 4 mg, IntraVENous, Q4H PRN, Gwenyth Allegra, MD  ???  sodium chloride (NS) flush 5-10 mL, 5-10 mL, IntraVENous, Q8H, Jasarevic, Muhamed, MD, 10 mL at 12/25/17 0534  ???  sodium chloride (NS) flush 5-10 mL, 5-10 mL, IntraVENous, PRN, Irving Burton, MD  ???  naloxone (NARCAN) injection 0.1 mg, 0.1 mg, IntraVENous, PRN, Jasarevic, Muhamed, MD  ???  acetaminophen (TYLENOL) tablet 650 mg, 650 mg, Oral, Q6H PRN, Earlie Lou, Muhamed, MD, 650 mg at 12/18/17 2114  ???  heparin (porcine) injection 5,000 Units, 5,000 Units, SubCUTAneous, Q12H, Irving Burton, MD, 5,000 Units at 12/25/17 1001  ???  nystatin (MYCOSTATIN) 100,000 unit/gram powder, , Topical, BID, Burns Timson, Meta Hatchet, MD      Lab:     Lab Data Reviewed: (see below)  No results found for this or any previous visit (from the past 24 hour(s)).    No results found.    Active Problems:    Senile dementia with acute confusional state, with behavioral disturbance (12/16/2017)      UTI (urinary tract infection) (12/23/2017)      Fall (12/23/2017)      Dementia (12/23/2017)      Discharge planning issues (12/23/2017)      ____________________________________________________________________      Attending Physician: Theodis Aguas, MD

## 2017-12-25 NOTE — Progress Notes (Signed)
Problem: Falls - Risk of  Goal: *Absence of Falls  Description  Document Angel Reed Fall Risk and appropriate interventions in the flowsheet.  Outcome: Progressing Towards Goal  Note:   Fall Risk Interventions:  Mobility Interventions: Utilize walker, cane, or other assistive device, Bed/chair exit alarm    Mentation Interventions: Adequate sleep, hydration, pain control, Bed/chair exit alarm, More frequent rounding    Medication Interventions: Bed/chair exit alarm    Elimination Interventions: Call light in reach, Bed/chair exit alarm    History of Falls Interventions: Bed/chair exit alarm, Door open when patient unattended

## 2017-12-25 NOTE — Progress Notes (Signed)
TC to [place] with [facility] [phone].  Bjorn Loser has many questions regarding the care of the patient and the financial situation of the patient.  SW directed her to contact the daughter, Aram Beecham for the financial piece.  SW did give her the basic needs of the patient (ADL's) and that the patient has dementia with some behaviors.      Bjorn Loser will contact the daughter and then call this SW with an update.  SW will continue to follow.

## 2017-12-25 NOTE — Progress Notes (Signed)
 NUTRITION FOLLOW UP    Current diet order: DIET CARDIAC Regular  DIET NUTRITIONAL SUPPLEMENTS Lunch, Dinner; [employer]    Vitamin/mineral supplementation:D5/NS @ 75 ml/hr     Other pertinent meds: synthroid, namenda    Current intake:[] N/A- NPO [x] very poor - refused meal 5/22 PM[] poor [] fair [] good    Weight:        Last 3 Recorded Weights in this Encounter    12/16/17 2129 12/16/17 2131   Weight: 68 kg (150 lb) 90.7 kg (200 lb)       BMI: Body mass index is 34.33 kg/m.    Unsure of weight discrepancy on 5/14 (suspect error, usual weight per daughter from initial RD assessment is 200#)     Physical assessment:   Chewing/swallowing issues: SLP 5/22 - do not advise po nutrition/hydration, consider at least short term alternate means of nutrition/hydration and medications, suggest pall care c/s for GOC   GI symptoms: abd soft, hypoactive BS; LBM 5/22 - soft   Fluid retention: none    Skin integrity: intact    Biochemical data: 5/20 - pert labs reviewed (no new CMP since 5/20)    Assessment of current MNT: Rec alternate route of nutrition support (e.g. Enteral TF) if within goals of care, appropriate per SLP guidelines and recs. Adequate to meet nutrition needs if tolerating TF at goal with no s/s GI intolerance, no s/s aspiration, min GRV < 500 ml     Nutrition diagnosis:  1. Inadequate oral intakerelated to hx dementia, p/w agitationas evidenced by medical hx, po estimated to be <75% x >3 months (chronic) - continues    2. Swallowing difficulty (possible)related to dementiaas evidenced by daughter reports hx of possible difficulty swallowing, throat pain (continues - SLP Eval rec'd NPO)    Nutrition recommendation:   If unable to advance PO diet 2/2 dysphagia, rec d/c PO diet, and rec enteral TF if within goals of care (rec Pall Care c/s)     If starting TF, rec Jevity 1.5 @ 40 ml/hr, plus Prosource 1 packet BID (1560 kcals, 91 gm protein, 790 ml free water)     Rec  adjust free water flushes accordingly per IVF - pt receiving IVF @ 75 ml/hr at this time. If IVF discontinued, rec add 240 ml free water flush q4 hours (provides total volume of 2230 ml daily)      Nutrition monitoring:TF tolerance/SLP eval input/Pall Care re: GOC, wt, BS, CMP, hydration, medical changes    Nutrition goals:Tol TF at goal with no s/s aspiration, no s/s GI intolerance, min GRV < , wt stable through LOS, nutrition related lab values WNL    Progress towards nutrition goals: []   Met/Ongoing     []   Progressing appropriately       [x]   Progressing slowly      []   Not Progressing    Nutrition level of care:  []  low     []  moderate     [x]  high    Code status: Full Code    Levorn Berber, RD  12/25/17

## 2017-12-25 NOTE — Progress Notes (Signed)
TC from [place], [facility] [phone].  She has spoken to the daughter and they are hoping to make placement into the home.  Bjorn Loser is coming to the hospital to see the patient this evening (12/25/2017)  The daughter will make arrangements to see the home.  If all things work out, patient will go to [facility].        Bjorn Loser was given Verita Schneiders number to contact if this SW isn't available

## 2017-12-26 LAB — BASIC METABOLIC PANEL
Anion Gap: 8 mmol/L (ref 5–15)
BUN: 20 mg/dl (ref 7–25)
CO2: 25 mEq/L (ref 21–32)
Calcium: 8.9 mg/dl (ref 8.5–10.1)
Chloride: 111 mEq/L — ABNORMAL HIGH (ref 98–107)
Creatinine: 0.6 mg/dl (ref 0.6–1.3)
EGFR IF NonAfrican American: >60
GFR African American: >60
Glucose: 126 mg/dl — ABNORMAL HIGH (ref 74–106)
Potassium: 3.4 mEq/L — ABNORMAL LOW (ref 3.5–5.1)
Sodium: 145 mEq/L (ref 136–145)

## 2017-12-26 LAB — CBC WITH AUTO DIFFERENTIAL
Basophils %: 0.4 % (ref 0–3)
Eosinophils %: 2.9 % (ref 0–5)
Hematocrit: 43.7 % (ref 37.0–50.0)
Hemoglobin: 14.3 gm/dl (ref 13.0–17.2)
Immature Granulocytes: 0.3 % (ref 0.0–3.0)
Lymphocytes %: 16.2 % — ABNORMAL LOW (ref 28–48)
MCH: 30.5 pg (ref 25.4–34.6)
MCHC: 32.7 gm/dl (ref 30.0–36.0)
MCV: 93.2 fL (ref 80.0–98.0)
MPV: 9.8 fL (ref 6.0–10.0)
Monocytes %: 11.9 % (ref 1–13)
Neutrophils %: 68.3 % — ABNORMAL HIGH (ref 34–64)
Nucleated RBCs: 0 (ref 0–0)
Platelets: 338 10*3/uL (ref 140–450)
RBC: 4.69 M/uL (ref 3.60–5.20)
RDW-SD: 45.1 (ref 36.4–46.3)
WBC: 9.7 10*3/uL (ref 4.0–11.0)

## 2017-12-26 LAB — METABOLIC PANEL, BASIC
Anion gap: 8 mmol/L (ref 5–15)
BUN: 20 mg/dl (ref 7–25)
CO2: 25 mEq/L (ref 21–32)
Calcium: 8.9 mg/dl (ref 8.5–10.1)
Chloride: 111 mEq/L — ABNORMAL HIGH (ref 98–107)
Creatinine: 0.6 mg/dl (ref 0.6–1.3)
GFR est AA: >60
GFR est non-AA: >60
Glucose: 126 mg/dl — ABNORMAL HIGH (ref 74–106)
Potassium: 3.4 mEq/L — ABNORMAL LOW (ref 3.5–5.1)
Sodium: 145 mEq/L (ref 136–145)

## 2017-12-26 LAB — CBC WITH AUTOMATED DIFF
BASOPHILS: 0.4 % (ref 0–3)
EOSINOPHILS: 2.9 % (ref 0–5)
HCT: 43.7 % (ref 37.0–50.0)
HGB: 14.3 gm/dl (ref 13.0–17.2)
IMMATURE GRANULOCYTES: 0.3 % (ref 0.0–3.0)
LYMPHOCYTES: 16.2 % — ABNORMAL LOW (ref 28–48)
MCH: 30.5 pg (ref 25.4–34.6)
MCHC: 32.7 gm/dl (ref 30.0–36.0)
MCV: 93.2 fL (ref 80.0–98.0)
MONOCYTES: 11.9 % (ref 1–13)
MPV: 9.8 fL (ref 6.0–10.0)
NEUTROPHILS: 68.3 % — ABNORMAL HIGH (ref 34–64)
NRBC: 0 (ref 0–0)
PLATELET: 338 10*3/uL (ref 140–450)
RBC: 4.69 M/uL (ref 3.60–5.20)
RDW-SD: 45.1 (ref 36.4–46.3)
WBC: 9.7 10*3/uL (ref 4.0–11.0)

## 2017-12-26 MED ORDER — HALOPERIDOL 2 MG/ML ORAL CONCENTRATE
2 mg/mL | ORAL | Status: DC | PRN
Start: 2017-12-26 — End: 2017-12-29
  Administered 2017-12-29: 02:00:00 via SUBLINGUAL

## 2017-12-26 MED ORDER — DEXTRAN 70-HYPROMELLOSE EYE DROPS
OPHTHALMIC | Status: DC | PRN
Start: 2017-12-26 — End: 2017-12-29

## 2017-12-26 MED ORDER — SCOPOLAMINE (1.3-1.5) MG 72 HR TRANSDERM PATCH
1 mg over 3 days | TRANSDERMAL | Status: DC | PRN
Start: 2017-12-26 — End: 2017-12-29

## 2017-12-26 MED ORDER — ACETAMINOPHEN (TYLENOL) SOLUTION 32MG/ML
Freq: Four times a day (QID) | ORAL | Status: DC | PRN
Start: 2017-12-26 — End: 2017-12-29

## 2017-12-26 MED ORDER — MORPHINE CONCENTRATE 20 MG/ML ORAL
100 mg/5 mL (20 mg/mL) | ORAL | 0 refills | Status: AC | PRN
Start: 2017-12-26 — End: 2017-12-29

## 2017-12-26 MED ORDER — GLYCOPYRROLATE 0.2 MG/ML IJ SOLN
0.2 mg/mL | Freq: Four times a day (QID) | INTRAMUSCULAR | Status: DC | PRN
Start: 2017-12-26 — End: 2017-12-29

## 2017-12-26 MED ORDER — ACETAMINOPHEN 650 MG RECTAL SUPPOSITORY
650 mg | Freq: Four times a day (QID) | RECTAL | Status: DC | PRN
Start: 2017-12-26 — End: 2017-12-29

## 2017-12-26 MED ORDER — ONDANSETRON (PF) 4 MG/2 ML INJECTION
4 mg/2 mL | Freq: Four times a day (QID) | INTRAMUSCULAR | Status: DC | PRN
Start: 2017-12-26 — End: 2017-12-29

## 2017-12-26 MED ORDER — MORPHINE 4 MG/ML SYRINGE
4 mg/mL | INTRAMUSCULAR | Status: DC | PRN
Start: 2017-12-26 — End: 2017-12-29

## 2017-12-26 MED ORDER — LORAZEPAM 2 MG/ML ORAL CONCENTRATE
2 mg/mL | ORAL | 0 refills | Status: AC | PRN
Start: 2017-12-26 — End: ?

## 2017-12-26 MED ORDER — MORPHINE CONCENTRATE 20 MG/ML ORAL
100 mg/5 mL (20 mg/mL) | ORAL | Status: DC | PRN
Start: 2017-12-26 — End: 2017-12-29

## 2017-12-26 MED ORDER — ACETAMINOPHEN 325 MG TABLET
325 mg | Freq: Four times a day (QID) | ORAL | Status: DC | PRN
Start: 2017-12-26 — End: 2017-12-29

## 2017-12-26 MED ORDER — ACETAMINOPHEN 650 MG RECTAL SUPPOSITORY
650 mg | Freq: Four times a day (QID) | RECTAL | 0 refills | Status: DC | PRN
Start: 2017-12-26 — End: 2017-12-29

## 2017-12-26 MED ORDER — LORAZEPAM 2 MG/ML ORAL CONCENTRATE
2 mg/mL | ORAL | Status: DC | PRN
Start: 2017-12-26 — End: 2017-12-29
  Administered 2017-12-29: 01:00:00 via SUBLINGUAL

## 2017-12-26 MED FILL — HEPARIN (PORCINE) 5,000 UNIT/ML IJ SOLN: 5000 unit/mL | INTRAMUSCULAR | Qty: 1

## 2017-12-26 MED FILL — BD POSIFLUSH NORMAL SALINE 0.9 % INJECTION SYRINGE: INTRAMUSCULAR | Qty: 10

## 2017-12-26 MED FILL — HALOPERIDOL LACTATE 5 MG/ML IJ SOLN: 5 mg/mL | INTRAMUSCULAR | Qty: 1

## 2017-12-26 MED FILL — ARTIFICIAL TEARS (DEXTRAN 70-HYPROMELLOSE) EYE DROPS: OPHTHALMIC | Qty: 15

## 2017-12-26 MED FILL — LEVOTHYROXINE 25 MCG TAB: 25 mcg | ORAL | Qty: 1

## 2017-12-26 NOTE — Progress Notes (Addendum)
Family will admit to [facility] w/hospice. Admission pending payment clearance - [facility] working with family to coordinate.   Requesting [facility] and SW. Matched in Nav[facility], order placed.

## 2017-12-26 NOTE — Consults (Addendum)
Palliative Care Consult    NAME:  Angel Reed   DOB:   1934/05/16   MRN:   [id number]     Date:  12/26/2017     PC Consulted by:  Dr. Delene Loll on 12/26/17 for "care decisions"  Primary Care Physician: None   Attending Physician: Angel Kidney, MD      Palliative Assessment/Plan:     Code Status:  DNR / DDNR 12/26/17 signed by dtr Angel Reed    Goals of Care: pt somnolent, non verbal & non interactive; long d/w pt's daughter Angel Reed:    ?? Change code status to DNR, DDNR signed by dtr    ?? CMO while in the acute care setting  ?? Comfort/pleasure feedings understanding & accepting risks of aspiration and inability to meet nutritional needs    ?? Hospice services on d/c  ?? Deferring selection of agency to [place] of adult home [facility]    Lengthy d/w re: progressive and terminal trajectory of dementia to include dysphagia and sequelae (ex. aspiration, inability to meet nutritional needs) with pt following expected disease course    Discussed risk vs benefits of full code, what the clinical picture of a resuscitation event entails, complications that can arise from resuscitation efforts to include trauma (fractured ribs, punctured lungs), anoxic brain injury, likelihood of requiring intubation and ventilation support, and clinical outcomes s/p resuscitation efforts are likely to be poor in light of pt's advanced age and severe dementia    Reviewed SLP swallow eval results and pt's very poor PO intake    Discussed risk > benefit of artificial nutrition    Discussed and recommended comfort/pleasure feedings for optimum QOL and accepting risk of aspiration and inability to meet nutritional needs    Discussed and recommended CMO as being no ANH (artificial nutrition/hydration)/Labs/ABX or any other intervention/RX not expressly for comfort with the goal for a natural death with comfort and dignity. Education with dtr on expected EOL stx and their management and likely clinical progression toward death.     Discussed and recommended hospice philosophy & general guidelines      Disposition:     Daughter Angel Reed has met with Angel Reed and wants to proceed with d/c to [facility] on hospice services, is deferring selection of hospice agency to [place]    Discussed with CM Angel Mage RN    Palliative Care Problem List:      ?? Severe dementia with behavioral disturbances, FAST 7a    ?? Dysphagia 2/2 above with very poor PO intake during this hospitalization, inadequate to meet nutritional needs  ?? Comfort/pleasure feeds as tolerated  ?? Good oral care daily and PRN    ?? Medical frailty, at risk for ongoing clinical, functional and cognitive decline and ongoing re-hospitalizations  ?? Recommend consideration of DNR code status as pt at risk for poor post resuscitation outcomes --> after d/w dtr:  Change code status to DNR, DDNR signed by dtr  ?? Recommend ongoing re-evaluation of GOC to include de-escalation of care though CMO/hospice level of care is a reasonable option if within pt's/family's GOC --> after d/w dtr: CMO while in the acute care setting; comfort/pleasure feedings understanding & accepting risks of aspiration and inability to meet nutritional needs and Hospice services on d/c    ?? CMO 12/26/17  ?? As pt unable to PO currently: ativan for restlessness, agitation, anxiety and haldol for delirium, agitation, n/v changed to liquid SL formulary      Current  capacity for decision making:   '[]'  Full Capacity  '[x]'  No Capacity due to dementia   '[]'  [place] Capacity     Patient does not have capacity to make decisions at this time based on U-CARE:  NO  Has UNDERSTANDING of the relevant information  NO  Responses are CONSISTENT over time, when questions are asked a different way and by different people  NO  APPRECIATION of the significance of information as it applied to the person's situation  NO  Ability to REASON with relevant information, logically weighing options  NO  Ability to EXPRESS a choice     **It takes 1 clinician to declare capacity    Advance Care Planning:     Advance Directives: None known           Medical Power of Attorney: None known           Living Will: None known           POLST forms:  Physician orders for life-sustaining treatment (POLST) form is not completed     No Advance Directive: Surrogate Decision Maker: Angel Reed (dtr) [phone]     The following were updated:  Discussed with Attending Physician/Provider: Stanton Kidney, MD   Other Physician (consultant):  reviewed chart notes   Nursing: Star, RN  Care Manager/Discharge Planner: Angel Mage RN      Thank you for allowing [place] to participate in the care of Angel Reed and her family.    Angel Gibney, NP-C  Palliative Medicine  [phone] office    The Palliative Medicine team is available Monday to Friday from 8am to 5pm at [phone]      HPI:   84 year old female with dementia w/behavioral disturbances    Brought in under observation status on 12/16/17 for behavioral disturbances: wandering and combative/aggressive toward family, likely r/t dementia as ER w/u was negative for acute findings; hospital admission ongoing for complex discharge needs, pending guardianship. SLP swallow eval 12/24/17 found severe oral dysphagia with patient not exhibiting readiness for further assessment of swallowing function via MBSS and SLP do not advise PO nutrition/hydration.    Pertinent comorbidities include hypothyroidism.    Last seen in ER 12/09/17, treated for acute cystitis.     PTA medications per EMR: celexa 51m PO daily, aricept 13mPO QHS, namenda 1068mO BID, synthroid, detrol LA.      Social History:   Palliative Care Social History: [place] address, widowed, lives with daughter since November 03, 2017; [place]; [id number]    [employer]:   '[]'   [employer]'[]'   Personal aides   '[]'   [employer]:     Subscriber: BealKaislee, Chaoscriber#: [id number]#:  Precert#:         [place] Phone:     Subscriber: BeBeatriz, Quintelascriber#: R1: [id number]#: [id number] 950cert#:         Patient Address: [address] [id number]/Cultural/Spiritual: PENTECOSTAL  Any spiritual / religious concerns:   '[]'  Yes - Referral to Pastoral Care   '[x]'   No   Caregiver Burnout:   '[x]'   Yes    '[]'   No   '[]'   N/A   '[]'  No Caregiver Present   Anticipatory grief assessment:   '[x]'  Normal   '[]'  Maladaptive      Review of Systems:   Unable to obtain due to clinical circumstance. Non verbal, non interactive.    Face: 0 = No particular expression or smile   Activity: 0= Lying quietly, normal position   Guarding: 0 = Lying quietly, no positioning of hands over areas of body   Physiology (VS): 0 = Stable VS   Respiratory: 0 = Baseline RR/SpO2    Functional Assessment:   Historical per dtr: pt was always confused and sometimes aggressive, combative, undirectable and sometimes wandered from the home; pt was ambulatory, able to speak in brief sentences, incontinent of bowel and bladder and fully reliant on all aspects of ADLs; limited knowledge of pt's functional status prior to living with her before 11/03/17    FAST: 7a    Palliative Performance Scale:  20%  Prior Palliative Performance Scale: 40%    Physical Exam:     Visit Vitals  BP 145/62 (BP 1 Location: Left arm, BP Patient Position: Supine)   Pulse 68   Temp 98.6 ??F (37 ??C)   Resp 20   Ht '5\' 4"'  (1.626 m)   Wt 90.7 kg (200 lb)   SpO2 97%   Breastfeeding? No   BMI 34.33 kg/m??     Body mass index is 34.33 kg/m??. Body mass index is 34.33 kg/m??.  Wt Readings from Last 3 Encounters:   12/16/17 90.7 kg (200 lb)   12/09/17 90.7 kg (200 lb)     Last Bowel Movement Date: 12/25/17     General Appearance: laying in bed; calm but non verbal and non interactive; in no acute distress; relaxed, frail, elderly; appears  chronically ill  HEENT: normocephalic, atraumatic, conjunctiva clear, oral mucosa pink, dry  Lungs:  clear to auscultation all fields bilaterally anteriorly; RR even and unlabored with fair inspiratory effort  Cardiovascular:  S1 S2 without murmur, rub, gallop, regular rate and rhythm; capillary refill <2 sec; DP pulses 2+ bilaterally; no edema; no cyanosis  GI: soft, non-distended, non-tender to palpation, active BS all quadrants  Neurological: as above; MAE equally and spontaneously  Skin:  Warm, perfused  Lines: PIV    Total time spent 90 minutes of which more than 50% was spent in coordination of care and counseling (time spent with patient/family face to face, physical exam, reviewing laboratory and imaging investigations, speaking with physicians and nursing staff involved in this patient's care)

## 2017-12-26 NOTE — Progress Notes (Signed)
Spoke with Suanne Marker at [facility] - did assessment with patient yesterday. Thinks she would be a good fit at her home. However, was unsure if patient income/family would be able to cover care cost.  Daughter Caren Griffins just met with Palliative and agrees with recommendation for comfort care. At this time states she will work with Suanne Marker to draw up contract for patient to move to [facility] with hospice.   Awaiting call from [facility] with preferred hospice provider.

## 2017-12-26 NOTE — Discharge Summary (Signed)
Discharge Summary  Patient Name: Angel Reed  Medical Record Number: [id number]  Date of Birth: [birth year]  Discharge Provider: Theodis Aguas, MD  Primary Care Provider: None    Admit date: 12/16/2017  Discharge date: 12/26/2017  Discharge Disposition: Home hospice.  Code Status: DNR        Follow-up appointments:     Follow-up Information     Follow up With Specialties Details Why Contact Info    None    None ([phone]) Patient stated that they have no PCP             Follow-up recommendations:     Follow up with PCP in 1 week      Discharge diagnosis:      1. Dementia  2. Recent UTI  3. Fall  4. Poor oral intake  5. Comfort care status        Discharge Medications:      Current Discharge Medication List      START taking these medications    Details   acetaminophen (TYLENOL) 650 mg suppository Insert 1 Suppository into rectum every six (6) hours as needed for Fever or Pain.  Qty: 10 Suppository, Refills: 0      LORazepam (INTENSOL) 2 mg/mL concentrated solution 0.25-1 mL by SubLINGual route every four (4) hours as needed for Agitation or Anxiety. Max Daily Amount: 12 mg.  Qty: 30 mL, Refills: 0    Associated Diagnoses: Comfort measures only status      morphine (ROXANOL) 100 mg/5 mL (20 mg/mL) concentrated solution 0.25-0.5 mL by SubLINGual route every two (2) hours as needed (SL for moderate-severe pain/dyspnea/tachypnea/SOB/EOL symptoms) for up to 3 days. Max Daily Amount: 120 mg.  Qty: 15 mL, Refills: 0    Associated Diagnoses: Comfort measures only status         STOP taking these medications       tolterodine ER (DETROL LA) 2 mg ER capsule Comments:   Reason for Stopping:         memantine (NAMENDA) 10 mg tablet Comments:   Reason for Stopping:         levothyroxine (SYNTHROID) 75 mcg tablet Comments:   Reason for Stopping:         donepezil (ARICEPT) 10 mg tablet Comments:   Reason for Stopping:         citalopram (CELEXA) 10 mg tablet Comments:   Reason for Stopping:          cephALEXin (KEFLEX) 500 mg capsule Comments:   Reason for Stopping:               Discharge Diet: Comfort feeding    Consultants: Palliative Care    Procedures:   * No surgery found *      History of presenting illness: Per admitting physician     Angel Reed is a 84 y.o. year old female who presents with  being combative and aggressive toward daughter. Pt has history of Dementia with increased aggressive behavior and wandering. Diagnosed with UTI last week.??Patient has bruise on face. Unable to contact family.   CT head -ve.       Hospital course:      Patient was admitted initially for placement issues and no acute medical problems during presentation.  She had a repeat UA and MRI of the brain due to her recent fall which is unremarkable.  Patient had episode of agitation and was requiring sedatives  Patient had no oral intake since admission and refused  to eat  Speech was consulted for swallow evaluation but unable to complete  Palliative care is consulted for discussion of goals of care and family decided to start her on comfort care and discharge her with hospice.  Hospice arrangement is being made and will be discharged as soon as her hospice care is in place.    Physical exam:  General:   Not in acute distress  HEENT: PERRLA, Neck Supple,  No JVD  Respiratory:   CTA bilaterally-no wheezes, rales, rhonchi, or crackles  Cardiac:  Regular Rate and Rythmn  - no murmurs, rubs or gallops  Abdominal:  Soft, non-tender, non-distended, positive bowel sounds  Extremities:  No cyanosis, or edema.  Skin: No rash  Neurological:  No focal neurological deficits      Discharge activity and restrictions: Bedrest    Time spent with discharging patient: 42 minutes      Theodis Aguas, MD  12/26/2017  1:50 PM    Copies: None

## 2017-12-26 NOTE — Other (Signed)
Bedside and Verbal shift change report given to Shamir R Isidro, RN   (oncoming nurse) by Estar,RN (offgoing nurse). Report included the following information SBAR, Kardex, MAR and Recent Results.

## 2017-12-26 NOTE — Other (Signed)
Bedside and Verbal shift change report given to Estarlyn Ramirez Criste, RN   (oncoming nurse) by Maria Ongoco RN (offgoing nurse). Report included the following information SBAR, Kardex, Intake/Output and MAR.

## 2017-12-26 NOTE — Progress Notes (Signed)
NUTRITION FOLLOW UP  ??  Current diet order:??DIET CARDIAC Regular  DIET NUTRITIONAL SUPPLEMENTS Lunch, Dinner; [employer]  ??  Vitamin/mineral supplementation:??D5/NS @ 75 ml/hr   ??  Other pertinent meds:??synthroid, namenda  ??  Current intake:??'[]' ??N/A- NPO ??????'[x]' ??very poor ??- refused meal 5/22 PM??????'[]' ??poor ??????????'[]' ??fair ??????????'[]' ??good  ??  Weight:??  ?? ?? ??   Last 3 Recorded Weights in this Encounter   ?? 12/16/17 2129 12/16/17 2131   Weight: 68 kg (150 lb) 90.7 kg (200 lb)   ??  ??  BMI:??Body mass index is 34.33 kg/m??.  ??  Unsure of weight discrepancy on 5/14 (suspect error, usual weight per daughter from initial RD assessment is 200#)   ??  Physical assessment:  ?? Chewing/swallowing issues:??SLP 5/22 - do not advise po nutrition/hydration, consider at least short term alternate means of nutrition/hydration and medications, suggest pall care c/s for GOC  ?? GI symptoms:??abd soft, active BS;??LBM 5/23 - soft  ?? Fluid retention:??none??  ?? Skin integrity:??intact  ??  Biochemical data:??5/24 - K+ 3.4 (replete via K+ rider as needed)  ??  Assessment of current MNT:??Rec alternate route of nutrition support (e.g. Enteral TF) if within goals of care, appropriate per SLP guidelines and recs. Adequate to meet nutrition needs if tolerating TF at goal with no s/s GI intolerance, no s/s aspiration, min GRV < 500 ml   ??  Nutrition diagnosis:  1. Inadequate oral intake??related to hx dementia, p/w agitation??as evidenced by medical hx, po estimated to be <75% x >3 months (chronic)??- continues  ??  2. Swallowing difficulty (possible)??related to dementia??as evidenced by daughter reports hx of possible difficulty swallowing, throat pain??(continues - SLP Eval rec'd NPO)  ??  Nutrition recommendation:??  If unable to advance PO diet 2/2 dysphagia, rec d/c PO diet, and rec enteral TF if within goals of care (rec Pall Care c/s)   ??  If starting TF, rec Jevity 1.5 @ 40 ml/hr, plus Prosource 1 packet BID (1560 kcals, 91 gm protein, 790 ml free water)   ??   Rec adjust free water flushes accordingly per IVF - pt receiving IVF @ 75 ml/hr at this time. If IVF discontinued, rec add 240 ml free water flush q4 hours (provides total volume of 2230 ml daily)    ??  Nutrition monitoring:??TF tolerance/SLP eval input/Pall Care re: GOC, wt, BS, CMP, hydration, medical changes  ??  Nutrition goals:??Tol TF at goal with no s/s aspiration, no s/s GI intolerance, min GRV < 540m, wt stable through LOS, nutrition related lab values WNL??  ??  Progress towards nutrition goals: '[]' ????Met/Ongoing ????????'[]' ????Progressing appropriately ????????????'[x]' ????Progressing slowly ??????????'[]' ????Not Progressing  ??  Nutrition level of care:????'[]' ??low ????????'[]' ??moderate ????????'[x]' ??high  ??  Code status:??Full Code  ??  RMurlean Hark RD  12/26/17

## 2017-12-26 NOTE — Progress Notes (Addendum)
Medical Progress Note      NAME: Angel Reed   DOB:  [birth year]  MRN:             [id number]    Date/Time: 12/26/2017  1:46 PM      Assessment:     1. Dementia  2. Recent UTI  3. Fall  4. Poor oral intake    Plan:   ?? Palliative care is consulted today as patient has no oral intake for the last several days to discuss goals of care   ?? Family decided to keep her on comfort care and to discharge her with hospice   ?? Care manager is arranging hospice care and possible discharge if all arrangements are complete   ?? We will only continue with comfort care now     Subjective:   Not eating or taking anything by mouth    Objective:     Vitals:    Last 24hrs VS reviewed since prior progress note. Most recent are:  Visit Vitals  BP 145/62 (BP 1 Location: Left arm, BP Patient Position: Supine)   Pulse 68   Temp 98.6 ??F (37 ??C)   Resp 20   Ht  (1.626 m)   Wt 90.7 kg (200 lb)   SpO2 97%   Breastfeeding? No   BMI 34.33 kg/m??     SpO2 Readings from Last 6 Encounters:   12/26/17 97%   12/09/17 96%            Intake/Output Summary (Last 24 hours) at 12/26/2017 1344  Last data filed at 12/26/2017 0930  Gross per 24 hour   Intake 1805 ml   Output ???   Net 1805 ml        Exam:     General:   Not in acute distress  HEENT: PERRLA, Neck Supple,  No JVD  Respiratory:   CTA bilaterally-no wheezes, rales, rhonchi, or crackles  Cardiac:  Regular Rate and Rythmn  - no murmurs, rubs or gallops  Abdominal:  Soft, non-tender, non-distended, positive bowel sounds  Extremities:  No cyanosis, or edema.  Skin: No rash  Neurological:  No focal neurological deficits    Medication:   Current Medications Reviewed    Current Facility-Administered Medications:   ???  glycopyrrolate (ROBINUL) injection 0.2-0.4 mg, 0.2-0.4 mg, IntraVENous, QID PRN, Dixon, Rachel A, NP  ???  dextran 70-hypromellose (ARTIFICIAL TEARS) ophthalmic solution 2 Drop, 2 Drop, Both Eyes, PRN, Dixon, Rachel A, NP   ???  morphine injection 1-4 mg, 1-4 mg, IntraVENous, Q2H PRN, Dixon, Rachel A, NP  ???  morphine (ROXANOL) 100 mg/5 mL (20 mg/mL) concentrated solution 5-10 mg, 5-10 mg, SubLINGual, Q2H PRN, Dixon, Rachel A, NP  ???  scopolamine (TRANSDERM-SCOP) 1 mg over 3 days 1 Patch, 1 Patch, TransDERmal, Q72H PRN, Dixon, Rachel A, NP  ???  ondansetron (ZOFRAN) injection 4 mg, 4 mg, IntraVENous, Q6H PRN, Dixon, Rachel A, NP  ???  haloperidol (HALDOL) 2 mg/mL oral solution 2 mg, 2 mg, SubLINGual, Q4H PRN, Dixon, Rachel A, NP  ???  LORazepam (INTENSOL) 2 mg/mL oral concentrate 0.5-2 mg, 0.5-2 mg, SubLINGual, Q4H PRN, Dixon, Rachel A, NP  ???  acetaminophen (TYLENOL) tablet 650 mg, 650 mg, Oral, Q6H PRN **OR** acetaminophen (TYLENOL) solution 650 mg, 650 mg, Oral, Q6H PRN **OR** acetaminophen (TYLENOL) suppository 650 mg, 650 mg, Rectal, Q6H PRN, Dixon, Rachel A, NP  ???  sodium chloride (NS) flush 5-10 mL, 5-10 mL, IntraVENous, PRN, Irving Burton, MD  ???  nystatin (MYCOSTATIN) 100,000 unit/gram powder, , Topical, BID, Tenny Craw, Meta Hatchet, MD      Lab:     Lab Data Reviewed: (see below)  Recent Results (from the past 24 hour(s))   CBC WITH AUTOMATED DIFF    Collection Time: 12/26/17  5:01 AM   Result Value Ref Range    WBC 9.7 4.0 - 11.0 1000/mm3    RBC 4.69 3.60 - 5.20 M/uL    HGB 14.3 13.0 - 17.2 gm/dl    HCT 16.1 09.6 - 04.5 %    MCV 93.2 80.0 - 98.0 fL    MCH 30.5 25.4 - 34.6 pg    MCHC 32.7 30.0 - 36.0 gm/dl    PLATELET 409 811 - 914 1000/mm3    MPV 9.8 6.0 - 10.0 fL    RDW-SD 45.1 36.4 - 46.3      NRBC 0 0 - 0      IMMATURE GRANULOCYTES 0.3 0.0 - 3.0 %    NEUTROPHILS 68.3 (H) 34 - 64 %    LYMPHOCYTES 16.2 (L) 28 - 48 %    MONOCYTES 11.9 1 - 13 %    EOSINOPHILS 2.9 0 - 5 %    BASOPHILS 0.4 0 - 3 %   METABOLIC PANEL, BASIC    Collection Time: 12/26/17  5:01 AM   Result Value Ref Range    Sodium 145 136 - 145 mEq/L    Potassium 3.4 (L) 3.5 - 5.1 mEq/L    Chloride 111 (H) 98 - 107 mEq/L    CO2 25 21 - 32 mEq/L    Glucose 126 (H) 74 - 106 mg/dl     BUN 20 7 - 25 mg/dl    Creatinine 0.6 0.6 - 1.3 mg/dl    GFR est AA >78.2      GFR est non-AA >60      Calcium 8.9 8.5 - 10.1 mg/dl    Anion gap 8 5 - 15 mmol/L       No results found.    Active Problems:    Senile dementia with acute confusional state, with behavioral disturbance (12/16/2017)      UTI (urinary tract infection) (12/23/2017)      Fall (12/23/2017)      Dementia (12/23/2017)      Discharge planning issues (12/23/2017)      ____________________________________________________________________      Attending Physician: Theodis Aguas, MD

## 2017-12-26 NOTE — Progress Notes (Signed)
20:30  Attempted to provide pastoral care but as I approached the Pt. she cringed and shrank back so I stayed outside the room and prayed for her.

## 2017-12-26 NOTE — Progress Notes (Signed)
Problem: Falls - Risk of  Goal: *Absence of Falls  Description  Document Schmid Fall Risk and appropriate interventions in the flowsheet.  Outcome: Progressing Towards Goal     Problem: Pressure Injury - Risk of  Goal: *Prevention of pressure injury  Description  Document Braden Scale and appropriate interventions in the flowsheet.  Outcome: Progressing Towards Goal

## 2017-12-26 NOTE — Discharge Summary (Signed)
Discharge Summary      Patient Name: Angel Reed  Medical Record Number: [id number]  Date of Birth: 06/16/1934  Discharge Provider: Theodis Aguas, MD  Primary Care Provider: None    Admit date: 12/16/2017  Discharge date: 12/26/2017  Discharge Disposition: Home hospice.  Code Status: DNR        Follow-up appointments:     Follow-up Information     Follow up With Specialties Details Why Contact Info    None    None ([phone]) Patient stated that they have no PCP             Follow-up recommendations:     Follow up with PCP in 1 week      Discharge diagnosis:      1. Dementia  2. Recent UTI  3. Fall  4. Poor oral intake  5. Comfort care status        Discharge Medications:      Current Discharge Medication List      START taking these medications    Details   acetaminophen (TYLENOL) 650 mg suppository Insert 1 Suppository into rectum every six (6) hours as needed for Fever or Pain.  Qty: 10 Suppository, Refills: 0      LORazepam (INTENSOL) 2 mg/mL concentrated solution 0.25-1 mL by SubLINGual route every four (4) hours as needed for Agitation or Anxiety. Max Daily Amount: 12 mg.  Qty: 30 mL, Refills: 0    Associated Diagnoses: Comfort measures only status      morphine (ROXANOL) 100 mg/5 mL (20 mg/mL) concentrated solution 0.25-0.5 mL by SubLINGual route every two (2) hours as needed (SL for moderate-severe pain/dyspnea/tachypnea/SOB/EOL symptoms) for up to 3 days. Max Daily Amount: 120 mg.  Qty: 15 mL, Refills: 0    Associated Diagnoses: Comfort measures only status         STOP taking these medications       tolterodine ER (DETROL LA) 2 mg ER capsule Comments:   Reason for Stopping:         memantine (NAMENDA) 10 mg tablet Comments:   Reason for Stopping:         levothyroxine (SYNTHROID) 75 mcg tablet Comments:   Reason for Stopping:         donepezil (ARICEPT) 10 mg tablet Comments:   Reason for Stopping:         citalopram (CELEXA) 10 mg tablet Comments:   Reason for Stopping:         cephALEXin (KEFLEX) 500 mg capsule  Comments:   Reason for Stopping:               Discharge Diet: Comfort feeding    Consultants: Palliative Care    Procedures:   * No surgery found *      History of presenting illness: Per admitting physician     Angel Reed is a 84 y.o. year old female who presents with  being combative and aggressive toward daughter. Pt has history of Dementia with increased aggressive behavior and wandering. Diagnosed with UTI last week.??Patient has bruise on face. Unable to contact family.   CT head -ve.       Hospital course:      Patient was admitted initially for placement issues and no acute medical problems during presentation.  She had a repeat UA and MRI of the brain due to her recent fall which is unremarkable.  Patient had episode of agitation and was requiring sedatives  Patient had no oral intake  since admission and refused to eat  Speech was consulted for swallow evaluation but unable to complete  Palliative care is consulted for discussion of goals of care and family decided to start her on comfort care and discharge her with hospice.  Hospice arrangement is being made and will be discharged as soon as her hospice care is in place.    Physical exam:  General:   Not in acute distress  HEENT: PERRLA, Neck Supple,  No JVD  Respiratory:   CTA bilaterally-no wheezes, rales, rhonchi, or crackles  Cardiac:  Regular Rate and Rythmn  - no murmurs, rubs or gallops  Abdominal:  Soft, non-tender, non-distended, positive bowel sounds  Extremities:  No cyanosis, or edema.  Skin: No rash  Neurological:  No focal neurological deficits      Discharge activity and restrictions: Bedrest    Time spent with discharging patient: 42 minutes      Theodis Aguas, MD  12/26/2017  1:50 PM    Copies: None

## 2017-12-26 NOTE — Progress Notes (Signed)
NUTRITION FOLLOW UP    Current diet order:DIET CARDIAC Regular  DIET NUTRITIONAL SUPPLEMENTS Lunch, Dinner; [employer]    Vitamin/mineral supplementation:D5/NS @ 75 ml/hr     Other pertinent meds:synthroid, namenda    Current intake:[] N/A- NPO [x] very poor - refused meal 5/22 PM[] poor [] fair [] good    Weight:       Last 3 Recorded Weights in this Encounter    12/16/17 2129 12/16/17 2131   Weight: 68 kg (150 lb) 90.7 kg (200 lb)       QQV:ZDGL mass index is 34.33 kg/m.    Unsure of weight discrepancy on 5/14 (suspect error, usual weight per daughter from initial RD assessment is 200#)     Physical assessment:   Chewing/swallowing issues:SLP 5/22 - do not advise po nutrition/hydration, consider at least short term alternate means of nutrition/hydration and medications, suggest pall care c/s for GOC   GI symptoms:abd soft, active BS;LBM 5/23 - soft   Fluid retention:none   Skin integrity:intact    Biochemical data:5/24 - K+ 3.4 (replete via K+ rider as needed)    Assessment of current MNT:Rec alternate route of nutrition support (e.g. Enteral TF) if within goals of care, appropriate per SLP guidelines and recs. Adequate to meet nutrition needs if tolerating TF at goal with no s/s GI intolerance, no s/s aspiration, min GRV < 500 ml     Nutrition diagnosis:  1. Inadequate oral intakerelated to hx dementia, p/w agitationas evidenced by medical hx, po estimated to be <75% x >3 months (chronic)- continues    2. Swallowing difficulty (possible)related to dementiaas evidenced by daughter reports hx of possible difficulty swallowing, throat pain(continues - SLP Eval rec'd NPO)    Nutrition recommendation:  If unable to advance PO diet 2/2 dysphagia, rec d/c PO diet, and rec enteral TF if within goals of care (rec Pall Care c/s)     If starting TF, rec Jevity 1.5 @ 40 ml/hr, plus Prosource 1 packet BID (1560 kcals, 91 gm protein, 790 ml free water)     Rec  adjust free water flushes accordingly per IVF - pt receiving IVF @ 75 ml/hr at this time. If IVF discontinued, rec add 240 ml free water flush q4 hours (provides total volume of 2230 ml daily)      Nutrition monitoring:TF tolerance/SLP eval input/Pall Care re: GOC, wt, BS, CMP, hydration, medical changes    Nutrition goals:Tol TF at goal with no s/s aspiration, no s/s GI intolerance, min GRV < , wt stable through LOS, nutrition related lab values WNL    Progress towards nutrition goals: [] Met/Ongoing [] Progressing appropriately [x] Progressing slowly [] Not Progressing    Nutrition level of care:[] low [] moderate [x] high    Code status:Full Code    Angel Reed, RD  12/26/17

## 2017-12-26 NOTE — Progress Notes (Signed)
Family will admit to [facility] w/hospice. Admission pending payment clearance - Rhonda working with family to coordinate.   Requesting [facility] and SW. Matched in [place], order placed.

## 2017-12-26 NOTE — Consults (Signed)
Consults by  Gaylord Shih, NP at 12/26/17 [phone]                Author: Gaylord Shih, NP  Service: Palliative Medicine  Author Type: Nurse Practitioner       Filed: 12/26/17 1443  Date of Service: 12/26/17 0910  Status: Attested Addendum          Editor: Gaylord Shih, NP (Nurse Practitioner)       Related Notes: Original Note by Gaylord Shih, NP (Nurse Practitioner) filed at 12/26/17 1441          Cosigner: Caroline Sauger, MD at 12/29/17 1125          Attestation signed by Caroline Sauger, MD at 12/29/17 1125          Palliative Care NP, Raphael Gibney, FNP-C,   saw and examined the patient independently-not shared visit. I reviewed the pertinent data, imaging,interval history and exam and agree  with her documentation and plan.      Levonne Spiller, M.D. [facility]   Palliative Medicine   [phone] office   [phone] cell                                    Palliative Care Consult      NAME:  Angel Reed     DOB:   Apr 20, 1934    MRN:   [id number]       Date:  12/26/2017       PC Consulted by:  Dr. Delene Loll on 12/26/17 for "care decisions"   Primary Care Physician: None    Attending Physician: Stanton Kidney, MD          Palliative Assessment/Plan:        Code Status:  DNR /  DDNR 12/26/17 signed by dtr Adria Dill      Goals of Care: pt somnolent, non verbal & non interactive; long d/w pt's daughter Deondrea Aguado:      ??  Change code status to DNR, DDNR signed by dtr      ??  CMO while in the acute care setting   ??  Comfort/pleasure feedings understanding & accepting risks of aspiration and inability to meet nutritional needs      ??  Hospice services on d/c   ??  Deferring selection of agency to [place] of adult home [place]      Lengthy d/w re: progressive and terminal trajectory of dementia to include dysphagia and sequelae (ex. aspiration, inability to meet nutritional needs) with pt following expected disease course      Discussed risk vs benefits of full code, what the clinical  picture of a resuscitation event entails, complications that can arise from resuscitation efforts to include trauma (fractured ribs, punctured lungs), anoxic brain injury, likelihood of requiring  intubation and ventilation support, and clinical outcomes s/p resuscitation efforts are likely to be poor in light of pt's advanced age and severe dementia      Reviewed SLP swallow eval results and pt's very poor PO intake      Discussed risk > benefit of artificial nutrition      Discussed and recommended comfort/pleasure feedings for optimum QOL and accepting risk of aspiration and inability to meet nutritional needs      Discussed and recommended CMO  as being no ANH (artificial nutrition/hydration)/Labs/ABX  or any other intervention/RX not expressly for comfort  with the goal for a natural death with comfort and dignity. Education with  dtr on expected EOL s tx and their management and likely clinical progression toward death.      Discussed and recommended hospice philosophy & general guidelines         Disposition:       Daughter Caren Griffins has met with Suanne Marker and wants to proceed with d/c to [facility] on hospice services, is deferring selection of hospice agency to [place]      Discussed with CM Wyatt Mage RN      Palliative Care Problem List:        ??  Severe dementia with behavioral disturbances, FAST 7a      ??  Dysphagia 2/2 above with very poor PO intake during this hospitalization, inadequate to meet nutritional needs   ??  Comfort/pleasure feeds as tolerated   ??  Good oral care daily and PRN      ??  Medical frailty, at risk for ongoing clinical, functional and cognitive decline and ongoing re-hospitalizations   ??  Recommend consideration of DNR code status as pt at risk for poor post resuscitation outcomes --> after d/w dtr:   Change code status to DNR, DDNR signed by dtr   ??  Recommend ongoing re-evaluation of GOC to include de-escalation of care though CMO/hospice level of care is a reasonable  option if within pt's/family's GOC --> after d/w dtr:  CMO while in the acute care setting; comfort/pleasure feedings understanding & accepting risks of aspiration and inability to meet nutritional needs and Hospice services on d/c      ??  CMO 12/26/17   ??  As pt unable to PO currently: ativan for restlessness, agitation, anxiety and haldol for delirium, agitation, n/v changed to liquid SL formulary         Current capacity for decision making:    []  Full Capacity   [x]  No Capacity due to dementia    []  [place] Capacity       Patient does not have capacity to make decisions at this time based on U-CARE:   NO  Has UNDERSTANDING of the relevant information   NO  Responses are CONSISTENT over time, when questions are asked a different way and by different people   NO  APPRECIATION of the significance of information as it applied to the person's situation   NO  Ability to REASON with relevant information, logically weighing options   NO  Ability to EXPRESS a choice      **It takes 1 clinician to declare capacity      Advance Care Planning:      Advance Directives: None known            Medical Power of Attorney: None known            Living Will: None known            POLST forms:  Physician orders for life-sustaining treatment (POLST) form is not completed      No Advance Directive: Surrogate Decision Maker: Catherina Pates (dtr) [phone]       The following were updated:   Discussed with Attending Physician/Provider: Stanton Kidney, MD    Other Physician (consultant):  reviewed chart notes    Nursing: Star, RN   Care Manager/Discharge Planner: Wyatt Mage RN        Thank you for allowing [place] to participate in the care of Angel Reed and her  family.      Raphael Gibney, NP-C   Palliative Medicine   [phone] office      The Palliative Medicine team is available Monday to Friday from 8am to 5pm at [phone]           HPI:     84 year old female with dementia w/behavioral disturbances      Brought in  under observation status on 12/16/17 for behavioral disturbances: wandering and combative/aggressive toward family, likely r/t dementia as ER w/u was negative for acute  findings; hospital admission ongoing for complex discharge needs, pending guardianship. SLP swallow eval 12/24/17 found severe oral dysphagia with patient not exhibiting readiness for further assessment of swallowing function via MBSS and SLP do not advise  PO nutrition/hydration.      Pertinent comorbidities include hypothyroidism.      Last seen in ER 12/09/17, treated for acute cystitis.       PTA medications per EMR: celexa 7m PO daily, aricept 145mPO QHS, namenda 1066mO BID, synthroid, detrol LA.           Social History:     Palliative Care Social History: [place] address, widowed, lives with daughter since November 03, 2017; [id number]      Community Resources:    []    [place][]    Personal aides    []    Me[employer] on Whee[place]an [place]:               Subscriber:  BealMarcelene, Weidemannbscriber#:  [id number]   Group#:    Precert#:                        [employer]ampart  Phone:               Subscriber:  BAlonnah, Lampkinsbscriber#:  [id number]         Group#:  [id number] 283ecert#:               Patient Address: [id number]/Cultural/Spiritual: PENTECOSTAL   Any spiritual / religious concerns:    []  Yes - Referral to Pastoral Care    [x]   No    Caregiver Burnout:    [x]    Yes     []    No    []    N/A    []   No Caregiver Present    Anticipatory grief assessment:    [x]  Normal    []  Maladaptive           Review of Systems:     Unable to obtain due to clinical circumstance. Non verbal, non interactive.      Face: 0 = No particular expression or smile    Activity: 0= Lying quietly, normal position    Guarding: 0 = Lying  quietly, no positioning of hands over areas of body    Physiology (VS): 0 = Stable VS  Respiratory: 0 = Baseline RR/SpO2        Functional Assessment:     Historical per dtr: pt was always confused and sometimes aggressive, combative, undirectable and sometimes wandered from the home; pt was ambulatory, able to speak in brief sentences,  incontinent of bowel and bladder and fully reliant on all aspects of ADLs; limited knowledge of pt's functional status prior to living with her before 11/03/17      FAST: 7a      Palliative Performance Scale:  20%   Prior Palliative Performance Scale: 40%        Physical Exam:        Visit Vitals      BP  145/62 (BP 1 Location: Left arm, BP Patient Position: Supine)     Pulse  68     Temp  98.6 ??F (37 ??C)     Resp  20     Ht  5' 4"  (1.626 m)     Wt  90.7 kg (200 lb)     SpO2  97%     Breastfeeding?  No        BMI  34.33 kg/m??        Body mass index is 34.33 kg/m??. Body mass index is 34.33 kg/m??.     Wt Readings from Last 3 Encounters:        12/16/17  90.7 kg (200 lb)        12/09/17  90.7 kg (200 lb)        Last Bowel Movement Date: 12/25/17       General Appearance: laying in bed; calm but non verbal and non interactive; in no acute distress; relaxed, frail, elderly; appears chronically ill   HEENT: normocephalic, atraumatic, conjunctiva clear, oral mucosa pink, dry   Lungs:  clear to auscultation all fields bilaterally anteriorly; RR even and unlabored with fair inspiratory effort   Cardiovascular:  S1 S2 without murmur, rub, gallop, regular rate and rhythm; capillary refill <2 sec; DP pulses 2+ bilaterally; no edema; no cyanosis   GI: soft, non-distended, non-tender to palpation, active BS all quadrants   Neurological: as above; MAE equally and spontaneously   Skin:  Warm, perfused   Lines: PIV      Total time spent 90 minutes of which more than 50% was spent in coordination of care and counseling (time spent with patient/family face to face, physical exam, reviewing laboratory   and imaging investigations, speaking with physicians and nursing staff involved in this patient's care)

## 2017-12-26 NOTE — Progress Notes (Signed)
20:30  Attempted to provide pastoral care but as I approached the Pt. she cringed and shrank back so I stayed outside the room and prayed for her.

## 2017-12-26 NOTE — Progress Notes (Signed)
Medical Progress Note      NAME: Angel Reed   DOB:  1934/05/26  MRN:             [id number]    Date/Time: 12/26/2017  1:46 PM      Assessment:     1. Dementia  2. Recent UTI  3. Fall  4. Poor oral intake    Plan:     ?? Palliative care is consulted today as patient has no oral intake for the last several days to discuss goals of care   ?? Family decided to keep her on comfort care and to discharge her with hospice   ?? Care manager is arranging hospice care and possible discharge if all arrangements are complete   ?? We will only continue with comfort care now     Subjective:     Not eating or taking anything by mouth    Objective:     Vitals:    Last 24hrs VS reviewed since prior progress note. Most recent are:  Visit Vitals  BP 145/62 (BP 1 Location: Left arm, BP Patient Position: Supine)   Pulse 68   Temp 98.6 ??F (37 ??C)   Resp 20   Ht  (1.626 m)   Wt 90.7 kg (200 lb)   SpO2 97%   Breastfeeding? No   BMI 34.33 kg/m??     SpO2 Readings from Last 6 Encounters:   12/26/17 97%   12/09/17 96%            Intake/Output Summary (Last 24 hours) at 12/26/2017 1344  Last data filed at 12/26/2017 0930  Gross per 24 hour   Intake 1805 ml   Output ???   Net 1805 ml        Exam:     General:   Not in acute distress  HEENT: PERRLA, Neck Supple,  No JVD  Respiratory:   CTA bilaterally-no wheezes, rales, rhonchi, or crackles  Cardiac:  Regular Rate and Rythmn  - no murmurs, rubs or gallops  Abdominal:  Soft, non-tender, non-distended, positive bowel sounds  Extremities:  No cyanosis, or edema.  Skin: No rash  Neurological:  No focal neurological deficits    Medication:   Current Medications Reviewed    Current Facility-Administered Medications:   ???  glycopyrrolate (ROBINUL) injection 0.2-0.4 mg, 0.2-0.4 mg, IntraVENous, QID PRN, Dixon, Rachel A, NP  ???  dextran 70-hypromellose (ARTIFICIAL TEARS) ophthalmic solution 2 Drop, 2 Drop, Both Eyes, PRN, Dixon, Rachel A, NP  ???  morphine injection 1-4 mg, 1-4 mg, IntraVENous,  Q2H PRN, Dixon, Rachel A, NP  ???  morphine (ROXANOL) 100 mg/5 mL (20 mg/mL) concentrated solution 5-10 mg, 5-10 mg, SubLINGual, Q2H PRN, Dixon, Rachel A, NP  ???  scopolamine (TRANSDERM-SCOP) 1 mg over 3 days 1 Patch, 1 Patch, TransDERmal, Q72H PRN, Dixon, Rachel A, NP  ???  ondansetron (ZOFRAN) injection 4 mg, 4 mg, IntraVENous, Q6H PRN, Dixon, Rachel A, NP  ???  haloperidol (HALDOL) 2 mg/mL oral solution 2 mg, 2 mg, SubLINGual, Q4H PRN, Dixon, Rachel A, NP  ???  LORazepam (INTENSOL) 2 mg/mL oral concentrate 0.5-2 mg, 0.5-2 mg, SubLINGual, Q4H PRN, Dixon, Rachel A, NP  ???  acetaminophen (TYLENOL) tablet 650 mg, 650 mg, Oral, Q6H PRN **OR** acetaminophen (TYLENOL) solution 650 mg, 650 mg, Oral, Q6H PRN **OR** acetaminophen (TYLENOL) suppository 650 mg, 650 mg, Rectal, Q6H PRN, Dixon, Rachel A, NP  ???  sodium chloride (NS) flush 5-10 mL, 5-10 mL, IntraVENous, PRN, Jasarevic,  Muhamed, MD  ???  nystatin (MYCOSTATIN) 100,000 unit/gram powder, , Topical, BID, Tenny Craw, Meta Hatchet, MD      Lab:     Lab Data Reviewed: (see below)  Recent Results (from the past 24 hour(s))   CBC WITH AUTOMATED DIFF    Collection Time: 12/26/17  5:01 AM   Result Value Ref Range    WBC 9.7 4.0 - 11.0 1000/mm3    RBC 4.69 3.60 - 5.20 M/uL    HGB 14.3 13.0 - 17.2 gm/dl    HCT 16.1 09.6 - 04.5 %    MCV 93.2 80.0 - 98.0 fL    MCH 30.5 25.4 - 34.6 pg    MCHC 32.7 30.0 - 36.0 gm/dl    PLATELET 409 811 - 914 1000/mm3    MPV 9.8 6.0 - 10.0 fL    RDW-SD 45.1 36.4 - 46.3      NRBC 0 0 - 0      IMMATURE GRANULOCYTES 0.3 0.0 - 3.0 %    NEUTROPHILS 68.3 (H) 34 - 64 %    LYMPHOCYTES 16.2 (L) 28 - 48 %    MONOCYTES 11.9 1 - 13 %    EOSINOPHILS 2.9 0 - 5 %    BASOPHILS 0.4 0 - 3 %   METABOLIC PANEL, BASIC    Collection Time: 12/26/17  5:01 AM   Result Value Ref Range    Sodium 145 136 - 145 mEq/L    Potassium 3.4 (L) 3.5 - 5.1 mEq/L    Chloride 111 (H) 98 - 107 mEq/L    CO2 25 21 - 32 mEq/L    Glucose 126 (H) 74 - 106 mg/dl    BUN 20 7 - 25 mg/dl    Creatinine 0.6 0.6 - 1.3  mg/dl    GFR est AA >78.2      GFR est non-AA >60      Calcium 8.9 8.5 - 10.1 mg/dl    Anion gap 8 5 - 15 mmol/L       No results found.    Active Problems:    Senile dementia with acute confusional state, with behavioral disturbance (12/16/2017)      UTI (urinary tract infection) (12/23/2017)      Fall (12/23/2017)      Dementia (12/23/2017)      Discharge planning issues (12/23/2017)      ____________________________________________________________________      Attending Physician: Theodis Aguas, MD

## 2017-12-27 NOTE — Progress Notes (Signed)
Problem: Pressure Injury - Risk of  Goal: *Prevention of pressure injury  Description  Document Braden Scale and appropriate interventions in the flowsheet.  Outcome: Progressing Towards Goal

## 2017-12-27 NOTE — Other (Signed)
Bedside and Verbal shift change report given to Emily A Houle  (oncoming nurse) by Mila (offgoing nurse). Report included the following information SBAR and Kardex.

## 2017-12-27 NOTE — Other (Addendum)
Pt very alert at this time, incomprehensible speech.  Pt able to follow some commands, ate entire chocolate ice cream and chocolate ensure pudding.  Also drank honey thick ensure compact, apple, and orange juice.

## 2017-12-27 NOTE — Progress Notes (Signed)
Returned to Ms. Bingley???s room with a Pastoral Care lap blanket. I showed it to her and provided her with a blessing. She pulled my face toward hers to give my cheek a kiss.

## 2017-12-27 NOTE — Other (Signed)
Bedside and Verbal shift change report given to Milagrina Almojuela, RN   (oncoming nurse) by Shamir Isidro,RN (offgoing nurse). Report included the following information SBAR and Kardex.

## 2017-12-27 NOTE — Progress Notes (Signed)
patietn is scheduled to go to [facility] on hospice on Monday.   [employer] will be the hospice company per daughter choice.   Waiting on dme to be delivered.   email to cm on Friday and Saturday.

## 2017-12-27 NOTE — Progress Notes (Signed)
Medical Progress Note      NAME: Angel Reed   DOB:  10-24-1933  MRN:             [id number]    Date/Time: 12/27/2017  1:46 PM      Assessment:     1. Severe Dementia with agitation  2. Recent UTI  3. Fall  4. Failure to thrive with poor oral intake    Plan:   ?? Palliative care is consulted  as patient has no oral intake for the last several days to discuss goals of care   ?? Family decided to keep her on comfort care and to discharge her with hospice   ?? Care manager is arranging hospice care and possible discharge if all arrangements are complete   ?? We will only continue with comfort care now   ?? Waiting for insurance to improve for home hospice    Subjective:   Not eating or taking anything by mouth    Objective:     Vitals:    Last 24hrs VS reviewed since prior progress note. Most recent are:  Visit Vitals  BP 157/45 (BP 1 Location: Left leg, BP Patient Position: Supine)   Pulse 65   Temp 98 ??F (36.7 ??C)   Resp 18   Ht  (1.626 m)   Wt 90.7 kg (200 lb)   SpO2 97%   Breastfeeding? No   BMI 34.33 kg/m??     SpO2 Readings from Last 6 Encounters:   12/27/17 97%   12/09/17 96%            Intake/Output Summary (Last 24 hours) at 12/27/2017 1153  Last data filed at 12/27/2017 0944  Gross per 24 hour   Intake 280 ml   Output 825 ml   Net -545 ml        Exam:     General:   Not in acute distress  HEENT: PERRLA, Neck Supple,  No JVD  Respiratory:   CTA bilaterally-no wheezes, rales, rhonchi, or crackles  Cardiac:  Regular Rate and Rythmn  - no murmurs, rubs or gallops  Abdominal:  Soft, non-tender, non-distended, positive bowel sounds  Extremities:  No cyanosis, or edema.  Skin: No rash  Neurological:  No focal neurological deficits    Medication:   Current Medications Reviewed    Current Facility-Administered Medications:   ???  glycopyrrolate (ROBINUL) injection 0.2-0.4 mg, 0.2-0.4 mg, IntraVENous, QID PRN, Dixon, Rachel A, NP  ???  dextran 70-hypromellose (ARTIFICIAL TEARS) ophthalmic solution 2 Drop,  2 Drop, Both Eyes, PRN, Dixon, Rachel A, NP  ???  morphine injection 1-4 mg, 1-4 mg, IntraVENous, Q2H PRN, Dixon, Rachel A, NP  ???  morphine (ROXANOL) 100 mg/5 mL (20 mg/mL) concentrated solution 5-10 mg, 5-10 mg, SubLINGual, Q2H PRN, Dixon, Rachel A, NP  ???  scopolamine (TRANSDERM-SCOP) 1 mg over 3 days 1 Patch, 1 Patch, TransDERmal, Q72H PRN, Dixon, Rachel A, NP  ???  ondansetron (ZOFRAN) injection 4 mg, 4 mg, IntraVENous, Q6H PRN, Dixon, Rachel A, NP  ???  haloperidol (HALDOL) 2 mg/mL oral solution 2 mg, 2 mg, SubLINGual, Q4H PRN, Dixon, Rachel A, NP  ???  LORazepam (INTENSOL) 2 mg/mL oral concentrate 0.5-2 mg, 0.5-2 mg, SubLINGual, Q4H PRN, Dixon, Rachel A, NP  ???  acetaminophen (TYLENOL) tablet 650 mg, 650 mg, Oral, Q6H PRN **OR** acetaminophen (TYLENOL) solution 650 mg, 650 mg, Oral, Q6H PRN **OR** acetaminophen (TYLENOL) suppository 650 mg, 650 mg, Rectal, Q6H PRN, Dixon, Windy Fast, NP  ???  sodium chloride (NS) flush 5-10 mL, 5-10 mL, IntraVENous, PRN, Irving Burton, MD  ???  nystatin (MYCOSTATIN) 100,000 unit/gram powder, , Topical, BID, Terefe, Meta Hatchet, MD      Lab:     Lab Data Reviewed: (see below)  No results found for this or any previous visit (from the past 24 hour(s)).    No results found.    Active Problems:    Senile dementia with acute confusional state, with behavioral disturbance (12/16/2017)      UTI (urinary tract infection) (12/23/2017)      Fall (12/23/2017)      Dementia (12/23/2017)      Discharge planning issues (12/23/2017)      ____________________________________________________________________      Attending Physician: Rosemary Holms, MD

## 2017-12-27 NOTE — Progress Notes (Signed)
Provided Pastoral Care to Ms. Paule. Shared with Ms. Achenbach that Jesus loved her, and that she was never alone. Talked to her about the love of our Savior. Read Our Daily Bread for Saturday, May 25. While the Chaplain was speaking, several times, Ms. Storts opened her eyes and looked toward the Chaplain. She took the Chaplain's hand and seemed to attempt to speak several times. Chaplain prayed with Ms. Krack, sang Christian gentle songs and hymns, and assured her that Jesus loved her. She held Chaplain's hand during most of the visit. Stayed with Ms. Bloch as long as possible. Her breathing became calm and quiet. Chaplain shared with Ms. Feig that she would need to step away, and left when Ms. Denslow appeared peaceful. She seemed comforted.

## 2017-12-27 NOTE — Progress Notes (Signed)
Medical Progress Note      NAME: Angel Reed   DOB:  Dec 05, 1933  MRN:             [id number]    Date/Time: 12/27/2017  1:46 PM      Assessment:     1. Severe Dementia with agitation  2. Recent UTI  3. Fall  4. Failure to thrive with poor oral intake    Plan:     ?? Palliative care is consulted  as patient has no oral intake for the last several days to discuss goals of care   ?? Family decided to keep her on comfort care and to discharge her with hospice   ?? Care manager is arranging hospice care and possible discharge if all arrangements are complete   ?? We will only continue with comfort care now   ?? Waiting for insurance to improve for home hospice    Subjective:     Not eating or taking anything by mouth    Objective:     Vitals:    Last 24hrs VS reviewed since prior progress note. Most recent are:  Visit Vitals  BP 157/45 (BP 1 Location: Left leg, BP Patient Position: Supine)   Pulse 65   Temp 98 ??F (36.7 ??C)   Resp 18   Ht  (1.626 m)   Wt 90.7 kg (200 lb)   SpO2 97%   Breastfeeding? No   BMI 34.33 kg/m??     SpO2 Readings from Last 6 Encounters:   12/27/17 97%   12/09/17 96%            Intake/Output Summary (Last 24 hours) at 12/27/2017 1153  Last data filed at 12/27/2017 0944  Gross per 24 hour   Intake 280 ml   Output 825 ml   Net -545 ml        Exam:     General:   Not in acute distress  HEENT: PERRLA, Neck Supple,  No JVD  Respiratory:   CTA bilaterally-no wheezes, rales, rhonchi, or crackles  Cardiac:  Regular Rate and Rythmn  - no murmurs, rubs or gallops  Abdominal:  Soft, non-tender, non-distended, positive bowel sounds  Extremities:  No cyanosis, or edema.  Skin: No rash  Neurological:  No focal neurological deficits    Medication:   Current Medications Reviewed    Current Facility-Administered Medications:   ???  glycopyrrolate (ROBINUL) injection 0.2-0.4 mg, 0.2-0.4 mg, IntraVENous, QID PRN, Dixon, Rachel A, NP  ???  dextran 70-hypromellose (ARTIFICIAL TEARS) ophthalmic solution 2 Drop,  2 Drop, Both Eyes, PRN, Dixon, Rachel A, NP  ???  morphine injection 1-4 mg, 1-4 mg, IntraVENous, Q2H PRN, Dixon, Rachel A, NP  ???  morphine (ROXANOL) 100 mg/5 mL (20 mg/mL) concentrated solution 5-10 mg, 5-10 mg, SubLINGual, Q2H PRN, Dixon, Rachel A, NP  ???  scopolamine (TRANSDERM-SCOP) 1 mg over 3 days 1 Patch, 1 Patch, TransDERmal, Q72H PRN, Dixon, Rachel A, NP  ???  ondansetron (ZOFRAN) injection 4 mg, 4 mg, IntraVENous, Q6H PRN, Dixon, Rachel A, NP  ???  haloperidol (HALDOL) 2 mg/mL oral solution 2 mg, 2 mg, SubLINGual, Q4H PRN, Dixon, Rachel A, NP  ???  LORazepam (INTENSOL) 2 mg/mL oral concentrate 0.5-2 mg, 0.5-2 mg, SubLINGual, Q4H PRN, Dixon, Rachel A, NP  ???  acetaminophen (TYLENOL) tablet 650 mg, 650 mg, Oral, Q6H PRN **OR** acetaminophen (TYLENOL) solution 650 mg, 650 mg, Oral, Q6H PRN **OR** acetaminophen (TYLENOL) suppository 650 mg, 650 mg, Rectal, Q6H PRN,  Rolla Etienne A, NP  ???  sodium chloride (NS) flush 5-10 mL, 5-10 mL, IntraVENous, PRN, Irving Burton, MD  ???  nystatin (MYCOSTATIN) 100,000 unit/gram powder, , Topical, BID, Terefe, Meta Hatchet, MD      Lab:     Lab Data Reviewed: (see below)  No results found for this or any previous visit (from the past 24 hour(s)).    No results found.    Active Problems:    Senile dementia with acute confusional state, with behavioral disturbance (12/16/2017)      UTI (urinary tract infection) (12/23/2017)      Fall (12/23/2017)      Dementia (12/23/2017)      Discharge planning issues (12/23/2017)      ____________________________________________________________________      Attending Physician: Rosemary Holms, MD

## 2017-12-27 NOTE — Progress Notes (Signed)
Provided Pastoral Care to Angel Reed. Shared with Angel Reed that Jesus loved her, and that she was never alone. Talked to her about the love of our Savior. Read Our Daily Bread for Saturday, May 25. While the Chaplain was speaking, several times, Angel Reed opened her eyes and looked toward the Chaplain. She took the Chaplain's hand and seemed to attempt to speak several times. Chaplain prayed with Angel Reed, sang Christian gentle songs and hymns, and assured her that Jesus loved her. She held Chaplain's hand during most of the visit. Stayed with Angel Reed as long as possible. Her breathing became calm and quiet. Chaplain shared with Angel Reed that she would need to step away, and left when Angel Reed appeared peaceful. She seemed comforted.

## 2017-12-27 NOTE — Progress Notes (Signed)
patietn is scheduled to go to [facility] on hospice on Monday.   [employer] will be the hospice company per daughter choice.   Waiting on dme to be delivered.   email to cm on Friday and Saturday.

## 2017-12-27 NOTE — Progress Notes (Signed)
 Returned to Ms. Deroy's room with a Pastoral Care lap blanket. I showed it to her and provided her with a blessing. She pulled my face toward hers to give my cheek a kiss.

## 2017-12-28 NOTE — Other (Signed)
Bedside and Verbal shift change report given to Emily A Houle  (oncoming nurse) by Mila (offgoing nurse). Report included the following information SBAR and Kardex.

## 2017-12-28 NOTE — Progress Notes (Signed)
Problem: Pressure Injury - Risk of  Goal: *Prevention of pressure injury  Description  Document Braden Scale and appropriate interventions in the flowsheet.  Outcome: Progressing Towards Goal

## 2017-12-28 NOTE — Progress Notes (Signed)
Medical Progress Note      NAME: Angel Reed   DOB:  May 14, 1934  MRN:             [id number]    Date/Time: 12/28/2017  1:46 PM      Assessment:     1. Severe Dementia with agitation  2. Recent UTI  3. Fall  4. Failure to thrive with poor oral intake    Plan:   ?? Palliative care is consulted  as patient has no oral intake for the last several days to discuss goals of care   ?? Family decided to keep her on comfort care and to discharge her with hospice   ?? Care manager is arranging hospice care and possible discharge if all arrangements are complete   ?? Continue with comfort care only  now   ?? Waiting for insurance to improve for home hospice  Subjective:   Not eating or taking anything by mouth    Objective:     Vitals:    Last 24hrs VS reviewed since prior progress note. Most recent are:  Visit Vitals  BP 166/62 (BP 1 Location: Right arm, BP Patient Position: Supine)   Pulse 92   Temp 97.4 ??F (36.3 ??C)   Resp 18   Ht  (1.626 m)   Wt 90.7 kg (200 lb)   SpO2 98%   Breastfeeding? No   BMI 34.33 kg/m??     SpO2 Readings from Last 6 Encounters:   12/28/17 98%   12/09/17 96%            Intake/Output Summary (Last 24 hours) at 12/28/2017 4540  Last data filed at 12/27/2017 2221  Gross per 24 hour   Intake 520 ml   Output ???   Net 520 ml        Exam:     General:   Not in acute distress  HEENT: PERRLA, Neck Supple,  No JVD  Respiratory:   CTA bilaterally-no wheezes, rales, rhonchi, or crackles  Cardiac:  Regular Rate and Rythmn  - no murmurs, rubs or gallops  Abdominal:  Soft, non-tender, non-distended, positive bowel sounds  Extremities:  No cyanosis, or edema.  Skin: No rash  Neurological:  No focal neurological deficits    Medication:   Current Medications Reviewed    Current Facility-Administered Medications:   ???  glycopyrrolate (ROBINUL) injection 0.2-0.4 mg, 0.2-0.4 mg, IntraVENous, QID PRN, Dixon, Rachel A, NP  ???  dextran 70-hypromellose (ARTIFICIAL TEARS) ophthalmic solution 2 Drop,  2 Drop, Both Eyes, PRN, Dixon, Rachel A, NP  ???  morphine injection 1-4 mg, 1-4 mg, IntraVENous, Q2H PRN, Dixon, Rachel A, NP  ???  morphine (ROXANOL) 100 mg/5 mL (20 mg/mL) concentrated solution 5-10 mg, 5-10 mg, SubLINGual, Q2H PRN, Dixon, Rachel A, NP  ???  scopolamine (TRANSDERM-SCOP) 1 mg over 3 days 1 Patch, 1 Patch, TransDERmal, Q72H PRN, Dixon, Rachel A, NP  ???  ondansetron (ZOFRAN) injection 4 mg, 4 mg, IntraVENous, Q6H PRN, Dixon, Rachel A, NP  ???  haloperidol (HALDOL) 2 mg/mL oral solution 2 mg, 2 mg, SubLINGual, Q4H PRN, Dixon, Rachel A, NP  ???  LORazepam (INTENSOL) 2 mg/mL oral concentrate 0.5-2 mg, 0.5-2 mg, SubLINGual, Q4H PRN, Dixon, Rachel A, NP  ???  acetaminophen (TYLENOL) tablet 650 mg, 650 mg, Oral, Q6H PRN **OR** acetaminophen (TYLENOL) solution 650 mg, 650 mg, Oral, Q6H PRN **OR** acetaminophen (TYLENOL) suppository 650 mg, 650 mg, Rectal, Q6H PRN, Dixon, Windy Fast, NP  ???  sodium  chloride (NS) flush 5-10 mL, 5-10 mL, IntraVENous, PRN, Irving Burton, MD  ???  nystatin (MYCOSTATIN) 100,000 unit/gram powder, , Topical, BID, Terefe, Meta Hatchet, MD      Lab:     Lab Data Reviewed: (see below)  No results found for this or any previous visit (from the past 24 hour(s)).    No results found.    Active Problems:    Senile dementia with acute confusional state, with behavioral disturbance (12/16/2017)      UTI (urinary tract infection) (12/23/2017)      Fall (12/23/2017)      Dementia (12/23/2017)      Discharge planning issues (12/23/2017)      ____________________________________________________________________      Attending Physician: Rosemary Holms, MD

## 2017-12-28 NOTE — Other (Signed)
Bedside and Verbal shift change report given to Milagrina Almojuela, RN   (oncoming nurse) by Emily Houle,RN (offgoing nurse). Report included the following information SBAR and Kardex.

## 2017-12-28 NOTE — Progress Notes (Signed)
Problem: Falls - Risk of  Goal: *Absence of Falls  Description  Document Schmid Fall Risk and appropriate interventions in the flowsheet.  Outcome: Progressing Towards Goal     Problem: Patient Education: Go to Patient Education Activity  Goal: Patient/Family Education  Outcome: Progressing Towards Goal     Problem: Pressure Injury - Risk of  Goal: *Prevention of pressure injury  Description  Document Braden Scale and appropriate interventions in the flowsheet.  Outcome: Progressing Towards Goal     Problem: Patient Education: Go to Patient Education Activity  Goal: Patient/Family Education  Outcome: Progressing Towards Goal     Problem: Dysphagia (Adult)  Goal: *Speech Goal: (INSERT TEXT)  Outcome: Progressing Towards Goal  Goal: Interventions  Outcome: Progressing Towards Goal     Problem: Patient Education: Go to Patient Education Activity  Goal: Patient/Family Education  Outcome: Progressing Towards Goal

## 2017-12-28 NOTE — Progress Notes (Signed)
Medical Progress Note      NAME: Angel Reed   DOB:  August 11, 1933  MRN:             [id number]    Date/Time: 12/28/2017  1:46 PM      Assessment:     1. Severe Dementia with agitation  2. Recent UTI  3. Fall  4. Failure to thrive with poor oral intake    Plan:     ?? Palliative care is consulted  as patient has no oral intake for the last several days to discuss goals of care   ?? Family decided to keep her on comfort care and to discharge her with hospice   ?? Care manager is arranging hospice care and possible discharge if all arrangements are complete   ?? Continue with comfort care only  now   ?? Waiting for insurance to improve for home hospice    Subjective:     Not eating or taking anything by mouth    Objective:     Vitals:    Last 24hrs VS reviewed since prior progress note. Most recent are:  Visit Vitals  BP 166/62 (BP 1 Location: Right arm, BP Patient Position: Supine)   Pulse 92   Temp 97.4 ??F (36.3 ??C)   Resp 18   Ht  (1.626 m)   Wt 90.7 kg (200 lb)   SpO2 98%   Breastfeeding? No   BMI 34.33 kg/m??     SpO2 Readings from Last 6 Encounters:   12/28/17 98%   12/09/17 96%            Intake/Output Summary (Last 24 hours) at 12/28/2017 8101  Last data filed at 12/27/2017 2221  Gross per 24 hour   Intake 520 ml   Output ???   Net 520 ml        Exam:     General:   Not in acute distress  HEENT: PERRLA, Neck Supple,  No JVD  Respiratory:   CTA bilaterally-no wheezes, rales, rhonchi, or crackles  Cardiac:  Regular Rate and Rythmn  - no murmurs, rubs or gallops  Abdominal:  Soft, non-tender, non-distended, positive bowel sounds  Extremities:  No cyanosis, or edema.  Skin: No rash  Neurological:  No focal neurological deficits    Medication:   Current Medications Reviewed    Current Facility-Administered Medications:   ???  glycopyrrolate (ROBINUL) injection 0.2-0.4 mg, 0.2-0.4 mg, IntraVENous, QID PRN, Dixon, Rachel A, NP  ???  dextran 70-hypromellose (ARTIFICIAL TEARS) ophthalmic solution 2 Drop, 2 Drop,  Both Eyes, PRN, Dixon, Rachel A, NP  ???  morphine injection 1-4 mg, 1-4 mg, IntraVENous, Q2H PRN, Dixon, Rachel A, NP  ???  morphine (ROXANOL) 100 mg/5 mL (20 mg/mL) concentrated solution 5-10 mg, 5-10 mg, SubLINGual, Q2H PRN, Dixon, Rachel A, NP  ???  scopolamine (TRANSDERM-SCOP) 1 mg over 3 days 1 Patch, 1 Patch, TransDERmal, Q72H PRN, Dixon, Rachel A, NP  ???  ondansetron (ZOFRAN) injection 4 mg, 4 mg, IntraVENous, Q6H PRN, Dixon, Rachel A, NP  ???  haloperidol (HALDOL) 2 mg/mL oral solution 2 mg, 2 mg, SubLINGual, Q4H PRN, Dixon, Rachel A, NP  ???  LORazepam (INTENSOL) 2 mg/mL oral concentrate 0.5-2 mg, 0.5-2 mg, SubLINGual, Q4H PRN, Dixon, Rachel A, NP  ???  acetaminophen (TYLENOL) tablet 650 mg, 650 mg, Oral, Q6H PRN **OR** acetaminophen (TYLENOL) solution 650 mg, 650 mg, Oral, Q6H PRN **OR** acetaminophen (TYLENOL) suppository 650 mg, 650 mg, Rectal, Q6H PRN, Rolla Etienne  A, NP  ???  sodium chloride (NS) flush 5-10 mL, 5-10 mL, IntraVENous, PRN, Irving Burton, MD  ???  nystatin (MYCOSTATIN) 100,000 unit/gram powder, , Topical, BID, Terefe, Meta Hatchet, MD      Lab:     Lab Data Reviewed: (see below)  No results found for this or any previous visit (from the past 24 hour(s)).    No results found.    Active Problems:    Senile dementia with acute confusional state, with behavioral disturbance (12/16/2017)      UTI (urinary tract infection) (12/23/2017)      Fall (12/23/2017)      Dementia (12/23/2017)      Discharge planning issues (12/23/2017)      ____________________________________________________________________      Attending Physician: Rosemary Holms, MD

## 2017-12-29 MED ORDER — ACETAMINOPHEN 650 MG RECTAL SUPPOSITORY
650 mg | Freq: Four times a day (QID) | RECTAL | 0 refills | Status: AC | PRN
Start: 2017-12-29 — End: ?

## 2017-12-29 MED FILL — HALOPERIDOL 2 MG/ML ORAL CONCENTRATE: 2 mg/mL | ORAL | Qty: 5

## 2017-12-29 MED FILL — LORAZEPAM INTENSOL 2 MG/ML ORAL CONCENTRATE: 2 mg/mL | ORAL | Qty: 1

## 2017-12-29 NOTE — Progress Notes (Addendum)
Transport to : [facility]  Reason : DEMENTIA; RECENT UTI; COMFORT CARE STATUS; UNABLE TO SAFELY TRANSFER OR AMBULATE; MOD TO MAX ASSIST  transport set up with : [facility]  Time/date 12/29/2017  11:30  Dc summary loaded YES  nurse/cm notified YES  envelope delivered YES  Insurance verified on face sheet YES y/n  auth needed  NO  y/n  auth #

## 2017-12-29 NOTE — Other (Addendum)
----------  DocumentID: [id number]------------------------------------------------              [facility]                       Patient Education Report         Name: SHALISA, MCQUADE                  Date: 12/17/2017    MRN: [id number]                    Time: 3:24:23 PM         Patient ordered video: 'Patient Safety: Stay Safe While you are in the Hospital'    from [id number] via phone number: [phone] at 3:24:23 PM    Description: This program outlines some of the precautions patients can take to ensure a speedy recovery without extra complications. The video emphasizes the importance of communicating with the healthcare team.    ----------DocumentID: [id number]------------------------------------------------                       [facility]          Patient Education Report - Discharge Summary        Date: 12/29/2017   Time: 11:43:13 AM   Name: MAURISHA, MONGEAU   MRN: [id number]      Account Number: [id number]      Education History:        Patient ordered video: 'Patient Safety: Stay Safe While you are in the Hospital' from [id number] on 12/17/2017 03:24:23 PM

## 2017-12-29 NOTE — Progress Notes (Addendum)
Discharge Date: 12/29/2017    Discharge Location: [facility]. Spoke with [facility] at [phone]. [facility] can accept patient today. RN to call report to [phone]. Daughter Cindy made aware that patient is being transferred today.     Discharge Needs: Medical transport     Transportation: Medical Transport time is 11:30AM     Communication with: Patient, RN, MD, Patients daughter Cindy.

## 2017-12-29 NOTE — Other (Signed)
Bedside shift change report given to melissa RN (oncoming nurse) by Emily RN (offgoing nurse). Report included the following information SBAR and Kardex.

## 2017-12-29 NOTE — Discharge Summary (Signed)
Discharge Summary  Patient Name: Angel Reed  Medical Record Number: [id number]  Date of Birth: 1934/06/27  Discharge Provider: Rosemary Holms, MD  Primary Care Provider: None    Admit date: 12/16/2017  Discharge date: 12/29/2017  Discharge Disposition: Home hospice.  Code Status: DNR        Follow-up appointments:     Follow-up Information     Follow up With Specialties Details Why Contact Info    Hospice  Hospice      None (395) Patient stated that they have no PCP             Follow-up recommendations:     Follow up with PCP in 1 week      Discharge diagnosis:      1. Dementia  2. Recent UTI  3. Fall  4. Poor oral intake  5. Comfort care status        Discharge Medications:      Current Discharge Medication List      START taking these medications    Details   acetaminophen (TYLENOL) 650 mg suppository Insert 1 Suppository into rectum every six (6) hours as needed for Fever or Pain.  Qty: 10 Suppository, Refills: 0      LORazepam (INTENSOL) 2 mg/mL concentrated solution 0.25-1 mL by SubLINGual route every four (4) hours as needed for Agitation or Anxiety. Max Daily Amount: 12 mg.  Qty: 30 mL, Refills: 0    Associated Diagnoses: Comfort measures only status      morphine (ROXANOL) 100 mg/5 mL (20 mg/mL) concentrated solution 0.25-0.5 mL by SubLINGual route every two (2) hours as needed (SL for moderate-severe pain/dyspnea/tachypnea/SOB/EOL symptoms) for up to 3 days. Max Daily Amount: 120 mg.  Qty: 15 mL, Refills: 0    Associated Diagnoses: Comfort measures only status         STOP taking these medications       tolterodine ER (DETROL LA) 2 mg ER capsule Comments:   Reason for Stopping:         memantine (NAMENDA) 10 mg tablet Comments:   Reason for Stopping:         levothyroxine (SYNTHROID) 75 mcg tablet Comments:   Reason for Stopping:         donepezil (ARICEPT) 10 mg tablet Comments:   Reason for Stopping:         citalopram (CELEXA) 10 mg tablet Comments:   Reason for Stopping:          cephALEXin (KEFLEX) 500 mg capsule Comments:   Reason for Stopping:               Discharge Diet: Comfort feeding    Consultants: Palliative Care    Procedures:   * No surgery found *      History of presenting illness: Per admitting physician     Angel Reed is a 84 y.o. year old female who presents with  being combative and aggressive toward daughter. Pt has history of Dementia with increased aggressive behavior and wandering. Diagnosed with UTI last week.??Patient has bruise on face. Unable to contact family.   CT head -ve.       Hospital course:      Patient was admitted initially for placement issues and no acute medical problems during presentation.  She had a repeat UA and MRI of the brain due to her recent fall which is unremarkable.  Patient had episode of agitation and was requiring sedatives  Patient had no oral intake since  admission and refused to eat  Speech was consulted for swallow evaluation but unable to complete  Palliative care is consulted for discussion of goals of care and family decided to start her on comfort care and discharge her with hospice.  Hospice arrangement has been made .  and will be discharged as soon as her hospice care is in place.    Physical exam:  Patient Vitals for the past 24 hrs:   Temp Pulse Resp BP SpO2   12/29/17 0738 97.5 ??F (36.4 ??C) 84 16 143/44 97 %   12/28/17 2022 98.4 ??F (36.9 ??C) 86 20 117/68 96 %   12/28/17 1748 ??? ??? 16 ??? ???   12/28/17 1626 ??? ??? 18 ??? ???   12/28/17 1434 ??? ??? 18 ??? ???   12/28/17 1027 ??? ??? 18 ??? ???       General:   Not in acute distress  HEENT: PERRLA, Neck Supple,  No JVD  Respiratory:   CTA bilaterally-no wheezes, rales, rhonchi, or crackles  Cardiac:  Regular Rate and Rythmn  - no murmurs, rubs or gallops  Abdominal:  Soft, non-tender, non-distended, positive bowel sounds  Extremities:  No cyanosis, or edema.  Skin: No rash  Neurological:  No focal neurological deficits      Discharge activity and restrictions: Bedrest     Time spent with discharging patient: 42 minutes      Rosemary Holms, MD  12/29/2017  8:35 am    Copies: None

## 2017-12-29 NOTE — Progress Notes (Signed)
Transport to : [facility]  Reason : DEMENTIA; RECENT UTI; COMFORT CARE STATUS; UNABLE TO SAFELY TRANSFER OR AMBULATE; MOD TO MAX ASSIST  transport set up with : [facility]  Time/date 12/29/2017  11:30  Dc summary loaded YES  nurse/cm notified YES  envelope delivered [employer] verified on face sheet YES y/n  auth needed  NO  y/n  auth #

## 2017-12-29 NOTE — Discharge Summary (Signed)
Discharge Summary      Patient Name: Angel Reed  Medical Record Number: [id number]  Date of Birth: 12/16/1933  Discharge Provider: Rosemary Holms, MD  Primary Care Provider: None    Admit date: 12/16/2017  Discharge date: 12/29/2017  Discharge Disposition: Home hospice.  Code Status: DNR        Follow-up appointments:     Follow-up Information     Follow up With Specialties Details Why Contact Info    Hospice  Hospice      None (395) Patient stated that they have no PCP             Follow-up recommendations:     Follow up with PCP in 1 week      Discharge diagnosis:      1. Dementia  2. Recent UTI  3. Fall  4. Poor oral intake  5. Comfort care status        Discharge Medications:      Current Discharge Medication List      START taking these medications    Details   acetaminophen (TYLENOL) 650 mg suppository Insert 1 Suppository into rectum every six (6) hours as needed for Fever or Pain.  Qty: 10 Suppository, Refills: 0      LORazepam (INTENSOL) 2 mg/mL concentrated solution 0.25-1 mL by SubLINGual route every four (4) hours as needed for Agitation or Anxiety. Max Daily Amount: 12 mg.  Qty: 30 mL, Refills: 0    Associated Diagnoses: Comfort measures only status      morphine (ROXANOL) 100 mg/5 mL (20 mg/mL) concentrated solution 0.25-0.5 mL by SubLINGual route every two (2) hours as needed (SL for moderate-severe pain/dyspnea/tachypnea/SOB/EOL symptoms) for up to 3 days. Max Daily Amount: 120 mg.  Qty: 15 mL, Refills: 0    Associated Diagnoses: Comfort measures only status         STOP taking these medications       tolterodine ER (DETROL LA) 2 mg ER capsule Comments:   Reason for Stopping:         memantine (NAMENDA) 10 mg tablet Comments:   Reason for Stopping:         levothyroxine (SYNTHROID) 75 mcg tablet Comments:   Reason for Stopping:         donepezil (ARICEPT) 10 mg tablet Comments:   Reason for Stopping:         citalopram (CELEXA) 10 mg tablet Comments:   Reason for Stopping:         cephALEXin (KEFLEX) 500 mg  capsule Comments:   Reason for Stopping:               Discharge Diet: Comfort feeding    Consultants: Palliative Care    Procedures:   * No surgery found *      History of presenting illness: Per admitting physician     Angel Reed is a 84 y.o. year old female who presents with  being combative and aggressive toward daughter. Pt has history of Dementia with increased aggressive behavior and wandering. Diagnosed with UTI last week.??Patient has bruise on face. Unable to contact family.   CT head -ve.       Hospital course:      Patient was admitted initially for placement issues and no acute medical problems during presentation.  She had a repeat UA and MRI of the brain due to her recent fall which is unremarkable.  Patient had episode of agitation and was requiring sedatives  Patient had  no oral intake since admission and refused to eat  Speech was consulted for swallow evaluation but unable to complete  Palliative care is consulted for discussion of goals of care and family decided to start her on comfort care and discharge her with hospice.  Hospice arrangement has been made .  and will be discharged as soon as her hospice care is in place.    Physical exam:  Patient Vitals for the past 24 hrs:   Temp Pulse Resp BP SpO2   12/29/17 0738 97.5 ??F (36.4 ??C) 84 16 143/44 97 %   12/28/17 2022 98.4 ??F (36.9 ??C) 86 20 117/68 96 %   12/28/17 1748 ??? ??? 16 ??? ???   12/28/17 1626 ??? ??? 18 ??? ???   12/28/17 1434 ??? ??? 18 ??? ???   12/28/17 1027 ??? ??? 18 ??? ???       General:   Not in acute distress  HEENT: PERRLA, Neck Supple,  No JVD  Respiratory:   CTA bilaterally-no wheezes, rales, rhonchi, or crackles  Cardiac:  Regular Rate and Rythmn  - no murmurs, rubs or gallops  Abdominal:  Soft, non-tender, non-distended, positive bowel sounds  Extremities:  No cyanosis, or edema.  Skin: No rash  Neurological:  No focal neurological deficits      Discharge activity and restrictions: Bedrest    Time spent with discharging patient: 42 minutes      Rosemary Holms, MD  12/29/2017  8:35 am    Copies: None

## 2017-12-29 NOTE — Progress Notes (Signed)
Discharge Date: 12/29/2017    Discharge Location: [facility]. Spoke with [facility] at [phone]. [facility] patient today. RN to call report to [phone]. Daughter [name] made aware that patient is being transferred today.     Discharge Needs: Medical transport     Transportation: Medical Transport time is 11:30AM     Communication with: Patient, RN, MD, Patients daughter [name].
